# Patient Record
Sex: Female | Born: 1980 | Race: Black or African American | Hispanic: No | Marital: Single | Smoking: Never smoker
Health system: Southern US, Community
[De-identification: ages and names within clinical notes are randomized; demographics above are authoritative.]

## PROBLEM LIST (undated history)

## (undated) DIAGNOSIS — T7840XA Allergy, unspecified, initial encounter: Secondary | ICD-10-CM

## (undated) DIAGNOSIS — F419 Anxiety disorder, unspecified: Secondary | ICD-10-CM

## (undated) DIAGNOSIS — R002 Palpitations: Secondary | ICD-10-CM

## (undated) DIAGNOSIS — R51 Headache: Secondary | ICD-10-CM

## (undated) DIAGNOSIS — F32A Depression, unspecified: Secondary | ICD-10-CM

## (undated) DIAGNOSIS — E785 Hyperlipidemia, unspecified: Secondary | ICD-10-CM

## (undated) DIAGNOSIS — F329 Major depressive disorder, single episode, unspecified: Principal | ICD-10-CM

## (undated) DIAGNOSIS — I499 Cardiac arrhythmia, unspecified: Secondary | ICD-10-CM

## (undated) DIAGNOSIS — K5900 Constipation, unspecified: Secondary | ICD-10-CM

## (undated) DIAGNOSIS — N649 Disorder of breast, unspecified: Secondary | ICD-10-CM

## (undated) HISTORY — DX: Major depressive disorder, single episode, unspecified: F32.9

## (undated) HISTORY — DX: Depression, unspecified: F32.A

## (undated) HISTORY — DX: Palpitations: R00.2

## (undated) HISTORY — DX: Anxiety disorder, unspecified: F41.9

## (undated) HISTORY — DX: Disorder of breast, unspecified: N64.9

## (undated) HISTORY — DX: Cardiac arrhythmia, unspecified: I49.9

## (undated) HISTORY — DX: Headache: R51

## (undated) HISTORY — DX: Allergy, unspecified, initial encounter: T78.40XA

## (undated) HISTORY — DX: Hyperlipidemia, unspecified: E78.5

## (undated) HISTORY — DX: Constipation, unspecified: K59.00

---

## 2002-05-14 ENCOUNTER — Encounter: Admission: RE | Admit: 2002-05-14 | Discharge: 2002-05-14 | Payer: Self-pay | Admitting: *Deleted

## 2002-05-15 ENCOUNTER — Other Ambulatory Visit: Admission: RE | Admit: 2002-05-15 | Discharge: 2002-05-15 | Payer: Self-pay | Admitting: Obstetrics and Gynecology

## 2002-07-09 ENCOUNTER — Encounter: Admission: RE | Admit: 2002-07-09 | Discharge: 2002-07-09 | Payer: Self-pay | Admitting: *Deleted

## 2007-11-07 HISTORY — PX: INDUCED ABORTION: SHX677

## 2009-08-08 DIAGNOSIS — I499 Cardiac arrhythmia, unspecified: Secondary | ICD-10-CM

## 2009-08-08 HISTORY — DX: Cardiac arrhythmia, unspecified: I49.9

## 2011-01-13 ENCOUNTER — Encounter: Payer: Self-pay | Admitting: Family Medicine

## 2011-01-13 ENCOUNTER — Ambulatory Visit (INDEPENDENT_AMBULATORY_CARE_PROVIDER_SITE_OTHER): Payer: Private Health Insurance - Indemnity | Admitting: Family Medicine

## 2011-01-13 DIAGNOSIS — R51 Headache: Secondary | ICD-10-CM

## 2011-01-13 DIAGNOSIS — E785 Hyperlipidemia, unspecified: Secondary | ICD-10-CM

## 2011-01-13 DIAGNOSIS — R002 Palpitations: Secondary | ICD-10-CM

## 2011-01-13 DIAGNOSIS — R109 Unspecified abdominal pain: Secondary | ICD-10-CM

## 2011-01-13 DIAGNOSIS — R000 Tachycardia, unspecified: Secondary | ICD-10-CM

## 2011-01-13 MED ORDER — BENEFIBER PO POWD: ORAL | Status: DC

## 2011-01-13 MED ORDER — ALIGN 4 MG PO CAPS: 1.0000 | ORAL_CAPSULE | ORAL | Status: DC

## 2011-01-13 MED ORDER — LORAZEPAM 0.5 MG PO TABS: ORAL_TABLET | ORAL | Status: DC

## 2011-01-13 NOTE — Patient Instructions (Signed)
Anxiety and Panic Attacks Your caregiver has informed you that you are having an anxiety or panic attack. There may be many forms of this. Most of the time these attacks come suddenly and without warning. They come at any time of day, including periods of sleep, and at any time of life. They may be strong and unexplained. Although panic attacks are very scary, they are physically harmless. Sometimes the cause of your anxiety is not known. Anxiety is a protective mechanism of the body in its fight or flight mechanism. Most of these perceived danger situations are actually nonphysical situations (such as anxiety over losing a job). CAUSES The causes of an anxiety or panic attack are many. Panic attacks may occur in otherwise healthy people given a certain set of circumstances. There may be a genetic cause for panic attacks. Some medications may also have anxiety as a side effect. SYMPTOMS Some of the most common feelings are:  Intense terror.  Dizziness, feeling faint.   Hot and cold flashes.   Fear of going crazy.   Feelings that nothing is real.   Sweating.   Shaking.   Chest pain or a fast heartbeat (palpitations).  Smothering, choking sensations.   Feelings of impending doom and that death is near.   Tingling of extremities, this may be from over breathing.   Altered reality (derealization).   Being detached from yourself (depersonalization).   Several symptoms can be present to make up anxiety or panic attacks. DIAGNOSIS The evaluation by your caregiver will depend on the type of symptoms you are experiencing. The diagnosis of anxiety or pain attack is made when no physical illness can be determined to be a cause of the symptoms. TREATMENT Treatment to prevent anxiety and panic attacks may include:  Avoidance of circumstances that cause anxiety.   Reassurance and relaxation.   Regular exercise.   Relaxation therapies, such as yoga.   Psychotherapy with a psychiatrist  or therapist.   Avoidance of caffeine, alcohol and illegal drugs.   Prescribed medication.  SEEK IMMEDIATE MEDICAL CARE IF:  You experience panic attack symptoms that are different than your usual symptoms.   You have any worsening or concerning symptoms.  Document Released: 07/25/2005 Document Re-Released: 01/12/2010 [facility] Patient Information 2011 [place], [place].  REmember to drink 8 oz of fluids, get 8 hours of sleep, exercise regularly and eat small, frequent meals, with lean proteins, complex/brown carbs and lots of fresh fruits/veggies and water  Deep breath when heart starts to race and you will hear about the Echocardiogram next week

## 2011-01-19 ENCOUNTER — Other Ambulatory Visit: Payer: Self-pay | Admitting: Family Medicine

## 2011-01-19 ENCOUNTER — Other Ambulatory Visit (INDEPENDENT_AMBULATORY_CARE_PROVIDER_SITE_OTHER): Payer: Private Health Insurance - Indemnity

## 2011-01-19 ENCOUNTER — Encounter: Payer: Self-pay | Admitting: Family Medicine

## 2011-01-19 DIAGNOSIS — E785 Hyperlipidemia, unspecified: Secondary | ICD-10-CM | POA: Insufficient documentation

## 2011-01-19 DIAGNOSIS — R109 Unspecified abdominal pain: Secondary | ICD-10-CM

## 2011-01-19 DIAGNOSIS — R002 Palpitations: Secondary | ICD-10-CM

## 2011-01-19 DIAGNOSIS — R000 Tachycardia, unspecified: Secondary | ICD-10-CM

## 2011-01-19 DIAGNOSIS — R519 Headache, unspecified: Secondary | ICD-10-CM

## 2011-01-19 HISTORY — DX: Headache, unspecified: R51.9

## 2011-01-19 HISTORY — DX: Hyperlipidemia, unspecified: E78.5

## 2011-01-19 HISTORY — DX: Palpitations: R00.2

## 2011-01-19 LAB — CBC WITH DIFFERENTIAL/PLATELET
Basophils Absolute: 0 10*3/uL (ref 0.0–0.1)
HCT: 35.2 % — ABNORMAL LOW (ref 36.0–46.0)
Hemoglobin: 12.1 g/dL (ref 12.0–15.0)
Lymphs Abs: 1.6 10*3/uL (ref 0.7–4.0)
MCHC: 34.4 g/dL (ref 30.0–36.0)
Monocytes Relative: 7.3 % (ref 3.0–12.0)
Neutro Abs: 1.2 10*3/uL — ABNORMAL LOW (ref 1.4–7.7)
RDW: 13.6 % (ref 11.5–14.6)

## 2011-01-19 LAB — LIPID PANEL
Cholesterol: 225 mg/dL — ABNORMAL HIGH (ref 0–200)
VLDL: 9.6 mg/dL (ref 0.0–40.0)

## 2011-01-19 LAB — LDL CHOLESTEROL, DIRECT: Direct LDL: 135.3 mg/dL

## 2011-01-19 LAB — HEPATIC FUNCTION PANEL
ALT: 9 U/L (ref 0–35)
AST: 18 U/L (ref 0–37)
Albumin: 4 g/dL (ref 3.5–5.2)
Total Protein: 7.5 g/dL (ref 6.0–8.3)

## 2011-01-19 LAB — RENAL FUNCTION PANEL
Albumin: 4 g/dL (ref 3.5–5.2)
BUN: 16 mg/dL (ref 6–23)
CO2: 27 mEq/L (ref 19–32)
Calcium: 8.8 mg/dL (ref 8.4–10.5)
Creatinine, Ser: 0.7 mg/dL (ref 0.4–1.2)
GFR: 120.77 mL/min (ref >60.00)

## 2011-01-19 NOTE — Assessment & Plan Note (Signed)
Patient with recent worsening of generalized headaches with photophobia or phonophobia, encouraged 8 hours of sleep nightly, 64oz of clear fluids, regular exercise, eat small frequent meals and use Aleve or Advil prn and reevaluate at next visit.

## 2011-01-19 NOTE — Assessment & Plan Note (Signed)
Some intermittent loose stool and constipation with some occasional discomfort, start a probiotic and a fiber suppelements. Needs complex carbs and good fluid intake

## 2011-01-19 NOTE — Assessment & Plan Note (Signed)
Patient reports previous lab work has confirmed hyperlipidemia, we will repeat a profile and patient is to avoid trans fats and start a fiber supplement.

## 2011-01-19 NOTE — Progress Notes (Signed)
Amber Copeland [id number] 02-May-1981 01/19/2011      Progress Note New Patient  Subjective  Chief Complaint  Chief Complaint  Patient presents with  . Establish Care    new patient    HPI  Patient is a 30 year old female in today for new patient appointment. She offers couple of concerns. She has a long history of palpitations dating back to childhood and previously worked up with a 30 day event monitor. No malignant rhythm was identified per the patient but she has been under a great deal of stress and the frequency and duration of the palpitations has increased. She has had episodes most days and they can sometimes have some SOb/nausea/abdominal pain/tremulouness. She had one episode about a month ago that lasted a whole day. Most episodes last seconds to minutes. With the increased stress she has also noted increase in daily HA but no photophobia or phonophobia.. She also has some intermittent diarrhea and constipation. She has not tried any medications for her symptoms thus far. Her job has been very stressful and there have been some increased stressors in her personal life as well.  Past Medical History  Diagnosis Date  . Irregular heartbeat 2011    cardiologist put pt on heart monitor for a month and diagnosed w/ irregular heartbeat  . Palpitations 01/19/2011  . Headache disorder 01/19/2011  . Hyperlipidemia 01/19/2011  . Abdominal pain 01/19/2011    Past Surgical History  Procedure Date  . Induced abortion 11-2007    Family History  Problem Relation Age of Onset  . Other Mother     vericose veins  . Kidney failure Father     kidney transplant-2003  . Heart murmur Sister   . Other Maternal Grandmother     knee surgery  . Other Paternal Grandmother     vericose veins  . Cancer Paternal Grandfather     History   Social History  . Marital Status: Married    Spouse Name: N/A    Number of Children: N/A  . Years of Education: N/A   Occupational History  . Not  on file.   Social History Main Topics  . Smoking status: Never Smoker   . Smokeless tobacco: Never Used  . Alcohol Use: Yes     occasionally  . Drug Use: No  . Sexually Active: Yes -- Female partner(s)   Other Topics Concern  . Not on file   Social History Narrative  . No narrative on file    No current outpatient prescriptions on file prior to visit.    Allergies  Allergen Reactions  . Penicillins Rash    Review of Systems  Review of Systems  Constitutional: Negative for fever, chills and malaise/fatigue.  HENT: Negative for hearing loss, nosebleeds and congestion.   Eyes: Negative for discharge.  Respiratory: Positive for shortness of breath. Negative for cough, sputum production and wheezing.   Cardiovascular: Positive for palpitations. Negative for chest pain and leg swelling.  Gastrointestinal: Positive for abdominal pain, diarrhea and constipation. Negative for heartburn, nausea, vomiting and blood in stool.  Genitourinary: Negative for dysuria, urgency, frequency and hematuria.  Musculoskeletal: Negative for myalgias, back pain and falls.  Skin: Negative for rash.  Neurological: Negative for dizziness, tremors, sensory change, focal weakness, loss of consciousness, weakness and headaches.  Endo/Heme/Allergies: Negative for polydipsia. Does not bruise/bleed easily.  Psychiatric/Behavioral: Negative for depression and suicidal ideas. The patient is not nervous/anxious and does not have insomnia.     Objective  BP 111/71  Pulse 65  Temp(Src) 98.7 F (37.1 C) (Oral)  Ht 5' 6.25" (1.683 m)  Wt 127 lb 12.8 oz (57.97 kg)  BMI 20.47 kg/m2  SpO2 100%  LMP 12/18/2010  Physical Exam  Physical Exam  Constitutional: She is oriented to person, place, and time and well-developed, well-nourished, and in no distress. No distress.  HENT:  Head: Normocephalic and atraumatic.  Right Ear: External ear normal.  Left Ear: External ear normal.  Nose: Nose normal.    Mouth/Throat: Oropharynx is clear and moist. No oropharyngeal exudate.  Eyes: Conjunctivae are normal. Pupils are equal, round, and reactive to light. Right eye exhibits no discharge. Left eye exhibits no discharge. No scleral icterus.  Neck: Normal range of motion. Neck supple. No thyromegaly present.  Cardiovascular: Normal rate, regular rhythm, normal heart sounds and intact distal pulses.   No murmur heard. Pulmonary/Chest: Effort normal and breath sounds normal. No respiratory distress. She has no wheezes. She has no rales.  Abdominal: Soft. Bowel sounds are normal. She exhibits no distension and no mass. There is no tenderness.  Musculoskeletal: Normal range of motion. She exhibits no edema and no tenderness.  Lymphadenopathy:    She has no cervical adenopathy.  Neurological: She is alert and oriented to person, place, and time. She has normal reflexes. No cranial nerve deficit. Coordination normal.  Skin: Skin is warm and dry. No rash noted. She is not diaphoretic.  Psychiatric: Mood, memory and affect normal.       Assessment & Plan  Palpitations Patient reports a long history of intermittent difficulties with palpitations, in the past she has had them worked up to some degree. Last year she reports she had a 30 day event monitor placed which she reports did not contain any toxic arrhythmias and no therapies were initiated. She is noting some irregular and fast rhythms would occur even when she was a girl. Lately she has been under increased stress at home  and at her job at [employer] [place] so the sense of racing heart beats has become more frequent. They usually laset only seconds to minutes and now they can last longer, she had an episode about a month ago that lasted a whole day and she is noting feeling tremulous, sob and anxious when these episodes occur. They certainly have an anxiety component to them she is given a handful of Lorazepam to use during with one of these episodes  to see if it helps, if she has chest pain and it does not resolve she needs to seek immediate medical care. Avoid caffeine.   Hyperlipidemia Patient reports previous lab work has confirmed hyperlipidemia, we will repeat a profile and patient is to avoid trans fats and start a fiber supplement.  Headache disorder Patient with recent worsening of generalized headaches with photophobia or phonophobia, encouraged 8 hours of sleep nightly, 64oz of clear fluids, regular exercise, eat small frequent meals and use Aleve or Advil prn and reevaluate at next visit.  Abdominal pain Some intermittent loose stool and constipation with some occasional discomfort, start a probiotic and a fiber suppelements. Needs complex carbs and good fluid intake

## 2011-01-19 NOTE — Assessment & Plan Note (Addendum)
Patient reports a long history of intermittent difficulties with palpitations, in the past she has had them worked up to some degree. Last year she reports she had a 30 day event monitor placed which she reports did not contain any toxic arrhythmias and no therapies were initiated. She is noting some irregular and fast rhythms would occur even when she was a girl. Lately she has been under increased stress at home  and at her job at [employer] [place] so the sense of racing heart beats has become more frequent. They usually laset only seconds to minutes and now they can last longer, she had an episode about a month ago that lasted a whole day and she is noting feeling tremulous, sob and anxious when these episodes occur. They certainly have an anxiety component to them she is given a handful of Lorazepam to use during with one of these episodes to see if it helps, if she has chest pain and it does not resolve she needs to seek immediate medical care. Avoid caffeine.

## 2011-02-17 ENCOUNTER — Encounter: Payer: Self-pay | Admitting: Family Medicine

## 2011-02-17 ENCOUNTER — Ambulatory Visit (INDEPENDENT_AMBULATORY_CARE_PROVIDER_SITE_OTHER): Payer: Private Health Insurance - Indemnity | Admitting: Family Medicine

## 2011-02-17 VITALS — BP 118/76 | HR 69 | Temp 98.1°F | Ht 66.25 in | Wt 126.8 lb

## 2011-02-17 DIAGNOSIS — R109 Unspecified abdominal pain: Secondary | ICD-10-CM

## 2011-02-17 DIAGNOSIS — R51 Headache: Secondary | ICD-10-CM

## 2011-02-17 DIAGNOSIS — R002 Palpitations: Secondary | ICD-10-CM

## 2011-02-17 DIAGNOSIS — R519 Headache, unspecified: Secondary | ICD-10-CM

## 2011-02-17 DIAGNOSIS — E785 Hyperlipidemia, unspecified: Secondary | ICD-10-CM

## 2011-02-17 MED ORDER — LORAZEPAM 0.5 MG PO TABS: ORAL_TABLET | ORAL | Status: AC

## 2011-02-17 MED ORDER — BUTALBITAL-APAP-CAFFEINE 50-325-40 MG PO TABS: 1.0000 | ORAL_TABLET | Freq: Two times a day (BID) | ORAL | Status: AC | PRN

## 2011-02-17 NOTE — Patient Instructions (Addendum)
Headaches, FAQs (Frequently Asked Questions) MIGRAINE HEADACHES Q: What is migraine? What causes it? How can I treat it? A: Generally, migraine headaches begin as a dull ache. Then they develop into a constant, throbbing, and pulsating pain. You may experience pain at the temples. You may experience pain at the front or back of one or both sides of the head. The pain is usually accompanied by a combination of:  Nausea.  Vomiting.  Sensitivity to light and noise.  Some people (about 15%) experience an aura (see below) before an attack. The cause of migraine is believed to be chemical reactions in the brain. Treatment for migraine may include over-the-counter or prescription medications. It may also include self-help techniques. These include relaxation training and biofeedback.  Q: What is an aura?  A: About 15% of people with migraine get an "aura". This is a sign of neurological symptoms that occur before a migraine headache. You may see wavy or jagged lines, dots, or flashing lights. You might experience tunnel vision or blind spots in one or both eyes. The aura can include visual or auditory hallucinations (something imagined). It may include disruptions in smell (such as strange odors), taste or touch. Other symptoms include:   Numbness.   A "pins and needles" sensation.   Difficulty in recalling or speaking the correct word.  These neurological events may last as long as 60 minutes. These symptoms will fade as the headache begins. Q: What is a trigger? A: Certain physical or environmental factors can lead to or "trigger" a migraine. These include:  Foods.   Hormonal changes.   Weather.  Stress.   It is important to remember that triggers are different for everyone. To help prevent migraine attacks, you need to figure out which triggers affect you. Keep a headache diary. This is a good way to track triggers. The diary will help you talk to your healthcare professional about your  condition. Q: Does weather affect migraines? A: Bright sunshine, hot, humid conditions, and drastic changes in barometric pressure may lead to, or "trigger," a migraine attack in some people. But studies have shown that weather does not act as a trigger for everyone with migraines. Q: What is the link between migraine and hormones? A: Hormones start and regulate many of your body's functions. Hormones keep your body in balance within a constantly changing environment. The levels of hormones in your body are unbalanced at times. Examples are during menstruation, pregnancy, or menopause. That can lead to a migraine attack. In fact, about three quarters of all women with migraine report that their attacks are related to the menstrual cycle.  Q: Is there an increased risk of stroke for migraine sufferers? A: The likelihood of a migraine attack causing a stroke is very remote. That is not to say that migraine sufferers cannot have a stroke associated with their migraines. In persons under age 13, the most common associated factor for stroke is migraine headache. But over the course of a person's normal life span, the occurrence of migraine headache may actually be associated with a reduced risk of dying from cerebrovascular disease due to stroke.  Q: What are acute medications for migraine? A: Acute medications are used to treat the pain of the headache after it has started. Examples over-the-counter medications, NSAIDs, ergots, and triptans.  Q: What are the triptans? A: Triptans are the newest class of abortive medications. They are specifically targeted to treat migraine. Triptans are vasoconstrictors. They moderate some chemical reactions in the  brain. The triptans work on receptors in your brain. Triptans help to restore the balance of a neurotransmitter called serotonin. Fluctuations in levels of serotonin are thought to be a main cause of migraine.  Q: Are over-the-counter medications for migraine  effective? A: Over-the-counter, or "OTC," medications may be effective in relieving mild to moderate pain and associated symptoms of migraine. But you should see your caregiver before beginning any treatment regimen for migraine.  Q: What are preventive medications for migraine? A: Preventive medications for migraine are sometimes referred to as "prophylactic" treatments. They are used to reduce the frequency, severity, and length of migraine attacks. Examples of preventive medications include antiepileptic medications, antidepressants, beta-blockers, calcium channel blockers, and NSAIDs (nonsteroidal anti-inflammatory drugs). Q: Why are anticonvulsants used to treat migraine? A: During the past few years, there has been an increased interest in antiepileptic drugs for the prevention of migraine. They are sometimes referred to as "anticonvulsants". Both epilepsy and migraine may be caused by similar reactions in the brain.  Q: Why are antidepressants used to treat migraine? A: Antidepressants are typically used to treat people with depression. They may reduce migraine frequency by regulating chemical levels, such as serotonin, in the brain.  Q: What alternative therapies are used to treat migraine? A: The term "alternative therapies" is often used to describe treatments considered outside the scope of conventional Western medicine. Examples of alternative therapy include acupuncture, acupressure, and yoga. Another common alternative treatment is herbal therapy. Some herbs are believed to relieve headache pain. Always discuss alternative therapies with your caregiver before proceeding. Some herbal products contain arsenic and other toxins. TENSION HEADACHES Q: What is a tension-type headache? What causes it? How can I treat it? A: Tension-type headaches occur randomly. They are often the result of temporary stress, anxiety, fatigue, or anger. Symptoms include soreness in your temples, a tightening  band-like sensation around your head (a "vice-like" ache). Symptoms can also include a pulling feeling, pressure sensations, and contracting head and neck muscles. The headache begins in your forehead, temples, or the back of your head and neck. Treatment for tension-type headache may include over-the-counter or prescription medications. Treatment may also include self-help techniques such as relaxation training and biofeedback. CLUSTER HEADACHES Q: What is a cluster headache? What causes it? How can I treat it? A: Cluster headache gets its name because the attacks come in groups. The pain arrives with little, if any, warning. It is usually on one side of the head. A tearing or bloodshot eye and a runny nose on the same side of the headache may also accompany the pain. Cluster headaches are believed to be caused by chemical reactions in the brain. They have been described as the most severe and intense of any headache type. Treatment for cluster headache includes prescription medication and oxygen. SINUS HEADACHES Q: What is a sinus headache? What causes it? How can I treat it? A: When a cavity in the bones of the face and skull (a sinus) becomes inflamed, the inflammation will cause localized pain. This condition is usually the result of an allergic reaction, a tumor, or an infection. If your headache is caused by a sinus blockage, such as an infection, you will probably have a fever. An x-ray will confirm a sinus blockage. Your caregiver's treatment might include antibiotics for the infection, as well as antihistamines or decongestants.  REBOUND HEADACHES Q: What is a rebound headache? What causes it? How can I treat it? A: A pattern of taking acute headache medications  too often can lead to a condition known as "rebound headache." A pattern of taking too much headache medication includes taking it more than 2 days per week or in excessive amounts. That means more than the label or a caregiver advises.  With rebound headaches, your medications not only stop relieving pain, they actually begin to cause headaches. Doctors treat rebound headache by tapering the medication that is being overused. Sometimes your caregiver will gradually substitute a different type of treatment or medication. Stopping may be a challenge. Regularly overusing a medication increases the potential for serious side effects. Consult a caregiver if you regularly use headache medications more than 2 days per week or more than the label advises. ADDITIONAL QUESTIONS AND ANSWERS Q: What is biofeedback? A: Biofeedback is a self-help treatment. Biofeedback uses special equipment to monitor your body's involuntary physical responses. Biofeedback monitors:  Breathing.   Pulse.   Heart rate.   Temperature.  Muscle tension.   Brain activity.   Biofeedback helps you refine and perfect your relaxation exercises. You learn to control the physical responses that are related to stress. Once the technique has been mastered, you do not need the equipment any more. Q: Are headaches hereditary? A: Four out of five (80%) of people that suffer report a family history of migraine. Scientists are not sure if this is genetic or a family predisposition. Despite the uncertainty, a child has a 50% chance of having migraine if one parent suffers. The child has a 75% chance if both parents suffer.  Q: Can children get headaches? A: By the time they reach high school, most young people have experienced some type of headache. Many safe and effective approaches or medications can prevent a headache from occurring or stop it after it has begun.  Q: What type of doctor should I see to diagnose and treat my headache? A: Start with your primary caregiver. Discuss his or her experience and approach to headaches. Discuss methods of classification, diagnosis, and treatment. Your caregiver may decide to recommend you to a headache specialist, depending upon your  symptoms or other physical conditions. Having diabetes, allergies, etc., may require a more comprehensive and inclusive approach to your headache. The National [employer] Foundation will provide, upon request, a list of [facility] physician members in your state. Document Released: 10/15/2003 Document Re-Released: 01/12/2010 [facility] Patient Information 2011 [place], [place].  Start a fish oil and fiber supplement, start walking and call with any concerns

## 2011-02-18 ENCOUNTER — Encounter: Payer: Self-pay | Admitting: Family Medicine

## 2011-02-18 NOTE — Assessment & Plan Note (Signed)
Greatly improved. Encouraged fiber supplement, increased fluids and exercise and she is to report worsening symptoms

## 2011-02-18 NOTE — Progress Notes (Signed)
Amber Copeland [id number] 1981/04/22 02/18/2011      Progress Note-Follow Up  Subjective  Chief Complaint  Chief Complaint  Patient presents with  . Follow-up    1 month follow up    HPI  Patient is a 42 African American female in today for reevaluation of multiple medical problems. She is in today to follow up with the patient reports she is feeling significantly better. She's only had a couple of episodes of palpitations since her last visit. I1 very short only seconds long and without associated symptoms. One day she reports the palpitations lasted a good portion of the day but she did not have any significant associated symptoms except for possibly a small amount of shortness of breath. She noted she eventually took lorazepam and will make her sleepy it did decrease her palpitations and shortness of breath. She denies ever having any chest pain, diaphoresis, nausea. She has a significant improvement in her abdominal pain as well. Tiny somewhat better and has cut down fast food. Bowels are moving readily no bloody or tarry stool. No fevers, chills, congestion. She has also noted that her headaches have greatly improved as well. Still  Having a couple of week without associated. She has not taking any meds for this  Past Medical History  Diagnosis Date  . Irregular heartbeat 2011    cardiologist put pt on heart monitor for a month and diagnosed w/ irregular heartbeat  . Palpitations 01/19/2011  . Headache disorder 01/19/2011  . Hyperlipidemia 01/19/2011  . Abdominal pain 01/19/2011    Past Surgical History  Procedure Date  . Induced abortion 11-2007    Family History  Problem Relation Age of Onset  . Other Mother     vericose veins  . Kidney failure Father     kidney transplant-2003  . Heart murmur Sister   . Other Maternal Grandmother     knee surgery  . Other Paternal Grandmother     vericose veins  . Cancer Paternal Grandfather     History   Social History  .  Marital Status: Married    Spouse Name: N/A    Number of Children: N/A  . Years of Education: N/A   Occupational History  . Not on file.   Social History Main Topics  . Smoking status: Never Smoker   . Smokeless tobacco: Never Used  . Alcohol Use: Yes     occasionally  . Drug Use: No  . Sexually Active: Yes -- Female partner(s)   Other Topics Concern  . Not on file   Social History Narrative  . No narrative on file    No current outpatient prescriptions on file prior to visit.    Allergies  Allergen Reactions  . Penicillins Rash    Review of Systems  Review of Systems  Constitutional: Negative for fever and malaise/fatigue.  HENT: Negative for congestion and sore throat.   Eyes: Negative for discharge.  Respiratory: Negative for shortness of breath.   Cardiovascular: Positive for palpitations. Negative for chest pain and leg swelling.  Gastrointestinal: Negative for nausea, abdominal pain and diarrhea.  Genitourinary: Negative for dysuria.  Musculoskeletal: Negative for falls.  Skin: Negative for rash.  Neurological: Positive for headaches. Negative for loss of consciousness.  Endo/Heme/Allergies: Negative for polydipsia.  Psychiatric/Behavioral: Negative for depression and suicidal ideas. The patient is nervous/anxious. The patient does not have insomnia.     Objective  BP 118/76  Pulse 69  Temp(Src) 98.1 F (36.7 C) (Oral)  Ht 5' 6.25" (1.683 m)  Wt 126 lb 12.8 oz (57.516 kg)  BMI 20.31 kg/m2  SpO2 100%  LMP 02/17/2011  Physical Exam  Physical Exam  Constitutional: She is oriented to person, place, and time and well-developed, well-nourished, and in no distress. No distress.  HENT:  Head: Normocephalic and atraumatic.  Eyes: Conjunctivae are normal.  Neck: Neck supple. No thyromegaly present.  Cardiovascular: Normal rate, regular rhythm and normal heart sounds.   No murmur heard. Pulmonary/Chest: Effort normal and breath sounds normal. She has no  wheezes.  Abdominal: She exhibits no distension and no mass.  Musculoskeletal: She exhibits no edema.  Lymphadenopathy:    She has no cervical adenopathy.  Neurological: She is alert and oriented to person, place, and time.  Skin: Skin is warm and dry. No rash noted. She is not diaphoretic.  Psychiatric: Memory, affect and judgment normal.    Lab Results  Component Value Date   TSH 1.49 01/19/2011   Lab Results  Component Value Date   WBC 3.3* 01/19/2011   HGB 12.1 01/19/2011   HCT 35.2* 01/19/2011   MCV 90.0 01/19/2011   PLT 210.0 01/19/2011   Lab Results  Component Value Date   CREATININE 0.7 01/19/2011   BUN 16 01/19/2011   NA 139 01/19/2011   K 3.9 01/19/2011   CL 107 01/19/2011   CO2 27 01/19/2011   Lab Results  Component Value Date   ALT 9 01/19/2011   AST 18 01/19/2011   ALKPHOS 42 01/19/2011   BILITOT 1.2 01/19/2011   Lab Results  Component Value Date   CHOL 225* 01/19/2011   Lab Results  Component Value Date   HDL 74.30 01/19/2011   No results found for this basename: [facility]   Lab Results  Component Value Date   TRIG 48.0 01/19/2011   Lab Results  Component Value Date   CHOLHDL 3 01/19/2011     Assessment & Plan  Palpitations Significantly improved since her last visit. She has only had a couple of episodes and all but one episode only last a couple of seconds. One episode did last the better part of a day. Did improve with a Lorazepam and she did not have any associated symptoms. No Sob, palp, diaphoresis. She agrees to monitor symptoms and if they worsen or begin to have associated symptoms then she will notify us. Minimize caffeine.  Headache disorder Greatly improved but still getting a couple as week. Lifestyle is discussed again, needs adequate sleep, hydration and a balanced diet. Minimize stress and needs to start exercising, encouraged her to start with a  5 minute walk daily and then to increase duration as tolerated.  She is given an rx for Carlisle Cater to  use prn but advised to try Advil first.  Abdominal pain Greatly improved. Encouraged fiber supplement, increased fluids and exercise and she is to report worsening symptoms  Hyperlipidemia Patient encouraged to avoid trans fats, reviewed labs with patient today. Also start a fish oil and fiber supplement and reassess in 6-9 months

## 2011-02-18 NOTE — Assessment & Plan Note (Signed)
Patient encouraged to avoid trans fats, reviewed labs with patient today. Also start a fish oil and fiber supplement and reassess in 6-9 months

## 2011-02-18 NOTE — Assessment & Plan Note (Signed)
Significantly improved since her last visit. She has only had a couple of episodes and all but one episode only last a couple of seconds. One episode did last the better part of a day. Did improve with a Lorazepam and she did not have any associated symptoms. No Sob, palp, diaphoresis. She agrees to monitor symptoms and if they worsen or begin to have associated symptoms then she will notify us. Minimize caffeine.

## 2011-02-18 NOTE — Assessment & Plan Note (Signed)
Greatly improved but still getting a couple as week. Lifestyle is discussed again, needs adequate sleep, hydration and a balanced diet. Minimize stress and needs to start exercising, encouraged her to start with a  5 minute walk daily and then to increase duration as tolerated.  She is given an rx for Carlisle Cater to use prn but advised to try Advil first.

## 2011-03-14 ENCOUNTER — Telehealth: Payer: Self-pay | Admitting: Family Medicine

## 2011-03-14 ENCOUNTER — Ambulatory Visit: Payer: Private Health Insurance - Indemnity | Admitting: Family Medicine

## 2011-03-14 NOTE — Telephone Encounter (Signed)
Please inform patient

## 2011-03-14 NOTE — Telephone Encounter (Signed)
Patient came by to update her information and pick up her instructions from Dr. Abner Greenspan.

## 2011-03-14 NOTE — Telephone Encounter (Signed)
So she should take Milk of Mag 2 tbls in 4 oz of prune juice by mouth and take a Dulcolax suppository pr at same time, may repeat in 4-6 hours if no results and again in the morning, if none of that helps can consider an enema or a bottle of Magnesium Citrate or she can come in to discuss her options. Once she has gotten her bowels moving she needs to make sure she is getting 64 oz of clear fluids, fiber supplement such as Benefiber 2 tsp in fluids twice daily and a probiotic/yogurt daily. If she is doing all that already then she needs to add Miralax 17 gm in 8 oz of fluids once to twice daily to her regimen until she finds the right combo to move her bowels every 1-3 days comfortably

## 2011-03-14 NOTE — Telephone Encounter (Signed)
Patient has not had a bowel movement for a week and is in severe pain.

## 2011-04-25 ENCOUNTER — Encounter: Payer: Self-pay | Admitting: Family Medicine

## 2011-04-25 ENCOUNTER — Ambulatory Visit (INDEPENDENT_AMBULATORY_CARE_PROVIDER_SITE_OTHER): Payer: Private Health Insurance - Indemnity | Admitting: Family Medicine

## 2011-04-25 VITALS — BP 106/65 | HR 62 | Temp 98.0°F | Ht 66.25 in | Wt 128.1 lb

## 2011-04-25 DIAGNOSIS — F329 Major depressive disorder, single episode, unspecified: Secondary | ICD-10-CM

## 2011-04-25 DIAGNOSIS — R51 Headache: Secondary | ICD-10-CM

## 2011-04-25 DIAGNOSIS — F341 Dysthymic disorder: Secondary | ICD-10-CM

## 2011-04-25 DIAGNOSIS — F419 Anxiety disorder, unspecified: Secondary | ICD-10-CM

## 2011-04-25 DIAGNOSIS — R002 Palpitations: Secondary | ICD-10-CM

## 2011-04-25 DIAGNOSIS — R109 Unspecified abdominal pain: Secondary | ICD-10-CM

## 2011-04-25 HISTORY — DX: Anxiety disorder, unspecified: F41.9

## 2011-04-25 MED ORDER — LORAZEPAM 0.5 MG PO TABS: ORAL_TABLET | ORAL | Status: DC

## 2011-04-25 MED ORDER — SERTRALINE HCL 50 MG PO TABS: ORAL_TABLET | ORAL | Status: DC

## 2011-04-25 NOTE — Patient Instructions (Signed)
Depression You have signs of depression. This is a common problem. It can occur at any age. It is often hard to recognize. People can suffer from depression and still have moments of enjoyment. Depression interferes with your basic ability to function in life. It upsets your relationships, sleep, eating, and work habits. CAUSES Depression is believed to be caused by an imbalance in brain chemicals. It may be triggered by an unpleasant event. Relationship crises, a death in the family, financial worries, retirement, or other stressors are normal causes of depression. Depression may also start for no known reason. Other factors that may play a part include medical illnesses, some medicines, genetics, and alcohol or drug abuse. SYMPTOMS  Feeling unhappy or worthless.   Long-lasting (chronic) tiredness or worn-out feeling.   Self-destructive thoughts and actions.   Not being able to sleep or sleeping too much.   Eating more than usual or not eating at all.   Headaches or feeling anxious.   Trouble concentrating or making decisions.   Unexplained physical problems and substance abuse.  TREATMENT Depression usually gets better with treatment. This can include:  Antidepressant medicines. It can take weeks before the proper dose is achieved and benefits are reached.   Talking with a therapist, clergyperson, counselor, or friend. These people can help you gain insight into your problem and regain control of your life.   Eating a good diet.   Getting regular physical exercise, such as walking for 30 minutes every day.   Not abusing alcohol or drugs.  Treating depression often takes 6 months or longer. This length of treatment is needed to keep symptoms from returning. Call your caregiver and arrange for follow-up care as suggested. SEEK IMMEDIATE MEDICAL CARE IF:  You start to have thoughts of hurting yourself or others.   Call your local emergency services (911 in [place]   Go to your  local medical emergency department.   Call the National Suicide Prevention Lifeline: 1-800-273-TALK 540-528-8713).  Document Released: 07/25/2005 Document Re-Released: 01/12/2010 [facility] Patient Information 2011 [place], [place].  Start Fish oil caps, Megared 1 cap daily, get 8-9 hours sleep, regular exercise, small frequent meals, lowfat bedtime snack

## 2011-04-25 NOTE — Assessment & Plan Note (Signed)
Improved greatly uses Lorazepam infrequently to manage these symptoms

## 2011-04-25 NOTE — Progress Notes (Signed)
Amber Copeland [id number] 11-15-1980 04/25/2011      Progress Note-Follow Up  Subjective  Chief Complaint  Chief Complaint  Patient presents with  . Depression    X months- pt requesting to try to get a note to put her out of work for stress and depression  . Stress    X months    HPI  She's a 30 year old [place] American female in today with increasing stressors. She reports she has been struggling with low mood and depression for quite some time now but recently has been getting worse. She works for [employer] [place] no pathologic changes at work. She has ongoing stressors at home as well. She notes difficulty sleeping. Trouble staying asleep and trouble falling asleep. She notes increased irritability, quick temper, difficulty concentrating and anhedonia. She denies being a danger to herself. She reports her brother has struggled with this in the past but refused to take medications. She is aware that her headaches, abdominal pain have been related to stress for her over the years. Her palpitations are greatly improved and lorazepam is helpful when she needs it. Her headaches are also greatly improved with increased fluids and better by mouth intake but she does still struggled him somewhat. She had a small episode of constipation recently but this responded to milk of magnesia and has not recurred.  Past Medical History  Diagnosis Date  . Irregular heartbeat 2011    cardiologist put pt on heart monitor for a month and diagnosed w/ irregular heartbeat  . Palpitations 01/19/2011  . Headache disorder 01/19/2011  . Hyperlipidemia 01/19/2011  . Abdominal pain 01/19/2011  . Anxiety and depression 04/25/2011    Past Surgical History  Procedure Date  . Induced abortion 11-2007    Family History  Problem Relation Age of Onset  . Other Mother     vericose veins  . Kidney failure Father     kidney transplant-2003  . Heart murmur Sister   . Other Maternal Grandmother     knee surgery    . Other Paternal Grandmother     vericose veins  . Cancer Paternal Grandfather     History   Social History  . Marital Status: Married    Spouse Name: N/A    Number of Children: N/A  . Years of Education: N/A   Occupational History  . Not on file.   Social History Main Topics  . Smoking status: Never Smoker   . Smokeless tobacco: Never Used  . Alcohol Use: Yes     occasionally  . Drug Use: No  . Sexually Active: Yes -- Female partner(s)   Other Topics Concern  . Not on file   Social History Narrative  . No narrative on file    No current outpatient prescriptions on file prior to visit.    Allergies  Allergen Reactions  . Penicillins Rash    Review of Systems  Review of Systems  Constitutional: Negative for fever and malaise/fatigue.  HENT: Negative for congestion.   Eyes: Negative for discharge.  Respiratory: Negative for shortness of breath.   Cardiovascular: Negative for chest pain, palpitations and leg swelling.  Gastrointestinal: Negative for nausea, abdominal pain and diarrhea.  Genitourinary: Negative for dysuria.  Musculoskeletal: Negative for falls.  Skin: Negative for rash.  Neurological: Negative for loss of consciousness and headaches.  Endo/Heme/Allergies: Negative for polydipsia.  Psychiatric/Behavioral: Positive for depression. Negative for suicidal ideas and substance abuse. The patient is nervous/anxious and has insomnia.  Objective  BP 106/65  Pulse 62  Temp(Src) 98 F (36.7 C) (Oral)  Ht 5' 6.25" (1.683 m)  Wt 128 lb 1.9 oz (58.115 kg)  BMI 20.52 kg/m2  SpO2 98%  LMP 03/09/2011  Physical Exam  Physical Exam  Constitutional: She is oriented to person, place, and time and well-developed, well-nourished, and in no distress. No distress.  HENT:  Head: Normocephalic and atraumatic.  Eyes: Conjunctivae are normal.  Neck: Neck supple. No thyromegaly present.  Cardiovascular: Normal rate, regular rhythm and normal heart sounds.    No murmur heard. Pulmonary/Chest: Effort normal and breath sounds normal. She has no wheezes.  Abdominal: She exhibits no distension and no mass.  Musculoskeletal: She exhibits no edema.  Lymphadenopathy:    She has no cervical adenopathy.  Neurological: She is alert and oriented to person, place, and time.  Skin: Skin is warm and dry. No rash noted. She is not diaphoretic.  Psychiatric: Memory, affect and judgment normal.    Lab Results  Component Value Date   TSH 1.49 01/19/2011   Lab Results  Component Value Date   WBC 3.3* 01/19/2011   HGB 12.1 01/19/2011   HCT 35.2* 01/19/2011   MCV 90.0 01/19/2011   PLT 210.0 01/19/2011   Lab Results  Component Value Date   CREATININE 0.7 01/19/2011   BUN 16 01/19/2011   NA 139 01/19/2011   K 3.9 01/19/2011   CL 107 01/19/2011   CO2 27 01/19/2011   Lab Results  Component Value Date   ALT 9 01/19/2011   AST 18 01/19/2011   ALKPHOS 42 01/19/2011   BILITOT 1.2 01/19/2011   Lab Results  Component Value Date   CHOL 225* 01/19/2011   Lab Results  Component Value Date   HDL 74.30 01/19/2011   No results found for this basename: [facility]   Lab Results  Component Value Date   TRIG 48.0 01/19/2011   Lab Results  Component Value Date   CHOLHDL 3 01/19/2011     Assessment & Plan  Anxiety and depression Patient acknowledges this is her major concern at present. She agrees to start the telemetry. We will start a 50 milligram tab. She will take half a tab daily for a week and then increase to a full tab. She was warned about possible side effects. She is also given handouts on possible counselors but is hesitant to start this at this time. She needs to make lifestyle modifications with regular exercise, small frequent healthy meals, good sleep. She may use the lorazepam to help with sleep intermittently over the next 2 weeks. We will have her short hiatus from work while she works on these many things but she is warned she will have to proceed with  further interventions if she feels this is an ongoing problem.  Palpitations Improved greatly uses Lorazepam infrequently to manage these symptoms  Headache disorder She reports a great improvement here with lifestyle changes. Continue same  Abdominal pain No recent pain, did have some constipation but that has improved with MOM and increased fluids

## 2011-04-25 NOTE — Assessment & Plan Note (Signed)
No recent pain, did have some constipation but that has improved with MOM and increased fluids

## 2011-04-25 NOTE — Assessment & Plan Note (Addendum)
Patient acknowledges this is her major concern at present. She agrees to start the telemetry. We will start a 50 milligram tab. She will take half a tab daily for a week and then increase to a full tab. She was warned about possible side effects. She is also given handouts on possible counselors but is hesitant to start this at this time. She needs to make lifestyle modifications with regular exercise, small frequent healthy meals, good sleep. She may use the lorazepam to help with sleep intermittently over the next 2 weeks. We will have her short hiatus from work while she works on these many things but she is warned she will have to proceed with further interventions if she feels this is an ongoing problem.

## 2011-04-25 NOTE — Assessment & Plan Note (Signed)
She reports a great improvement here with lifestyle changes. Continue same

## 2011-05-05 ENCOUNTER — Telehealth: Payer: Self-pay | Admitting: Family Medicine

## 2011-05-05 NOTE — Telephone Encounter (Signed)
Left a message for patient to return my call. 

## 2011-05-05 NOTE — Telephone Encounter (Signed)
Patient needs detailed notes sent to her insurance company explaining why she was out of work- claim #[id number] faxed to [phone]

## 2011-05-05 NOTE — Telephone Encounter (Signed)
Patient informed and states that is okay

## 2011-05-05 NOTE — Telephone Encounter (Signed)
I will do a note when she comes in on Tuesday unless this is an absolute emergency

## 2011-05-10 ENCOUNTER — Ambulatory Visit: Payer: Private Health Insurance - Indemnity | Admitting: Family Medicine

## 2011-08-23 ENCOUNTER — Ambulatory Visit: Payer: Private Health Insurance - Indemnity | Admitting: Family Medicine

## 2011-08-23 DIAGNOSIS — Z0289 Encounter for other administrative examinations: Secondary | ICD-10-CM

## 2011-09-07 ENCOUNTER — Telehealth: Payer: Self-pay | Admitting: Family Medicine

## 2011-09-07 NOTE — Telephone Encounter (Signed)
Patient needs her Health Screening form sent to her insurance company, can you send it for her? Please contact patient

## 2011-09-07 NOTE — Telephone Encounter (Signed)
Informed patient that it was sent on 08-19-11

## 2011-11-08 ENCOUNTER — Ambulatory Visit (INDEPENDENT_AMBULATORY_CARE_PROVIDER_SITE_OTHER)
Admission: RE | Admit: 2011-11-08 | Discharge: 2011-11-08 | Disposition: A | Discharge status: Home / Self Care | Payer: Managed Care, Other (non HMO) | Source: Ambulatory Visit | Attending: Family Medicine | Admitting: Family Medicine

## 2011-11-08 ENCOUNTER — Telehealth: Payer: Self-pay | Admitting: *Deleted

## 2011-11-08 ENCOUNTER — Ambulatory Visit (HOSPITAL_BASED_OUTPATIENT_CLINIC_OR_DEPARTMENT_OTHER)
Admission: RE | Admit: 2011-11-08 | Discharge: 2011-11-08 | Disposition: A | Discharge status: Home / Self Care | Payer: Managed Care, Other (non HMO) | Source: Ambulatory Visit | Attending: Family Medicine | Admitting: Family Medicine

## 2011-11-08 ENCOUNTER — Ambulatory Visit (INDEPENDENT_AMBULATORY_CARE_PROVIDER_SITE_OTHER): Payer: Managed Care, Other (non HMO) | Admitting: Family Medicine

## 2011-11-08 ENCOUNTER — Encounter: Payer: Self-pay | Admitting: Family Medicine

## 2011-11-08 ENCOUNTER — Ambulatory Visit: Payer: Managed Care, Other (non HMO)

## 2011-11-08 VITALS — BP 116/70 | HR 81 | Temp 98.3°F | Wt 127.2 lb

## 2011-11-08 DIAGNOSIS — F341 Dysthymic disorder: Secondary | ICD-10-CM

## 2011-11-08 DIAGNOSIS — N83209 Unspecified ovarian cyst, unspecified side: Secondary | ICD-10-CM | POA: Insufficient documentation

## 2011-11-08 DIAGNOSIS — N926 Irregular menstruation, unspecified: Secondary | ICD-10-CM

## 2011-11-08 DIAGNOSIS — R109 Unspecified abdominal pain: Secondary | ICD-10-CM | POA: Insufficient documentation

## 2011-11-08 DIAGNOSIS — D72829 Elevated white blood cell count, unspecified: Secondary | ICD-10-CM

## 2011-11-08 DIAGNOSIS — N949 Unspecified condition associated with female genital organs and menstrual cycle: Secondary | ICD-10-CM

## 2011-11-08 DIAGNOSIS — F419 Anxiety disorder, unspecified: Secondary | ICD-10-CM

## 2011-11-08 DIAGNOSIS — G44209 Tension-type headache, unspecified, not intractable: Secondary | ICD-10-CM

## 2011-11-08 DIAGNOSIS — F4541 Pain disorder exclusively related to psychological factors: Secondary | ICD-10-CM

## 2011-11-08 DIAGNOSIS — N912 Amenorrhea, unspecified: Secondary | ICD-10-CM

## 2011-11-08 LAB — CBC WITH DIFFERENTIAL/PLATELET
Basophils Absolute: 0 10*3/uL (ref 0.0–0.1)
Eosinophils Absolute: 0.2 10*3/uL (ref 0.0–0.7)
Lymphocytes Relative: 59.3 % — ABNORMAL HIGH (ref 12.0–46.0)
MCHC: 33.7 g/dL (ref 30.0–36.0)
Monocytes Absolute: 0.3 10*3/uL (ref 0.1–1.0)
Neutrophils Relative %: 25.3 % — ABNORMAL LOW (ref 43.0–77.0)
Platelets: 194 10*3/uL (ref 150.0–400.0)
RDW: 13.4 % (ref 11.5–14.6)

## 2011-11-08 LAB — BASIC METABOLIC PANEL
CO2: 26 mEq/L (ref 19–32)
Calcium: 9.5 mg/dL (ref 8.4–10.5)
GFR: 102.15 mL/min (ref >60.00)
Potassium: 4 mEq/L (ref 3.5–5.1)
Sodium: 138 mEq/L (ref 135–145)

## 2011-11-08 LAB — HEPATIC FUNCTION PANEL
AST: 18 U/L (ref 0–37)
Alkaline Phosphatase: 45 U/L (ref 39–117)
Bilirubin, Direct: 0.1 mg/dL (ref 0.0–0.3)
Total Protein: 8 g/dL (ref 6.0–8.3)

## 2011-11-08 MED ORDER — SERTRALINE HCL 50 MG PO TABS: ORAL_TABLET | ORAL | Status: DC

## 2011-11-08 NOTE — Telephone Encounter (Signed)
We received pap from he health department and it was sent to be scanned.     KP

## 2011-11-08 NOTE — Progress Notes (Signed)
  Subjective:    Amber Copeland is a 31 y.o. female who presents for evaluation of headache. Symptoms began about several months ago. Generally, the headaches last about several hours and occur every day. The headaches do not seem to be related to any time of day or year. The headaches are usually sharp and are located in front and back of neck.  The patient rates her most severe headaches a 9 on a scale from 1 to 10. Recently, the headaches have been increasing in frequency. Work attendance or other daily activities are not affected by the headaches. Precipitating factors include: stress. The headaches are usually not preceded by an aura. Associated neurologic symptoms: none. The patient denies decreased physical activity, depression, dizziness, loss of balance, muscle weakness, numbness of extremities, speech difficulties, vision problems, vomiting in the early morning and worsening school/work performance. Home treatment has included ibuprofen, lorazepam and darkening the room with little improvement. Other history includes: nothing pertinent. Family history includes migraine headaches in aunt.  She is also c/o missing period in Feb---it came 2nd week in March.  Normally she has period at end of the month.   Last pap was last summer and it was normal per pt.   The following portions of the patient's history were reviewed and updated as appropriate: allergies, current medications, past family history, past medical history, past social history, past surgical history and problem list.  Review of Systems Pertinent items are noted in HPI.    Objective:    BP 116/70  Pulse 81  Temp(Src) 98.3 F (36.8 C) (Oral)  Wt 127 lb 3.2 oz (57.698 kg)  SpO2 99%  LMP 10/17/2011 General appearance: alert, cooperative, appears stated age and no distress Head: Normocephalic, without obvious abnormality, atraumatic Eyes: conjunctivae/corneas clear. PERRL, EOM's intact. Fundi benign. Ears: normal TM's and  external ear canals both ears Nose: Nares normal. Septum midline. Mucosa normal. No drainage or sinus tenderness. Throat: lips, mucosa, and tongue normal; teeth and gums normal Neck: no JVD, supple, symmetrical, trachea midline and thyroid not enlarged, symmetric, no tenderness/mass/nodules Lungs: clear to auscultation bilaterally Heart: regular rate and rhythm, S1, S2 normal, no murmur, click, rub or gallop Abdomen: soft, non-tender; bowel sounds normal; no masses,  no organomegaly   neuro  Assessment:    Common migraine   amenorrhea--- check [place] and labs Plan:    Lie in darkened room and apply cold packs as needed for pain. Prophylactic therapy: prophylaxis with antidepressant therapy due to high frequency of pain. Side effect profile discussed in detail. Asked to keep headache diary. Patient reassured that neurodiagnostic workup not indicated from benign H&P. Follow up in 4 weeks.

## 2011-11-08 NOTE — Patient Instructions (Signed)

## 2011-11-08 NOTE — Telephone Encounter (Signed)
Pt called & left msg on triage vmail stating that she saw Dr. Laury Axon this morning & she wanted to let her know that her last PAP was 10.5.2011 @ [facility]

## 2011-11-09 LAB — PATHOLOGIST SMEAR REVIEW

## 2011-11-10 ENCOUNTER — Other Ambulatory Visit: Payer: Self-pay | Admitting: Family Medicine

## 2011-11-15 ENCOUNTER — Telehealth: Payer: Self-pay

## 2011-11-15 NOTE — Telephone Encounter (Signed)
Message copied by Arnette Norris on Tue Nov 15, 2011  1:18 PM ------      Message from: Lelon Perla      Created: Thu Nov 10, 2011  9:37 AM       Essentially normal but I would like to refer to hematologist to evaluate for neutropenia

## 2011-11-15 NOTE — Telephone Encounter (Signed)
Discussed with patient and she voiced understanding. Referral put in        KP 

## 2011-11-18 ENCOUNTER — Telehealth: Payer: Self-pay | Admitting: Hematology & Oncology

## 2011-11-18 NOTE — Telephone Encounter (Signed)
Left patient message to call and schedule appointment. She is not aware of 12-19-11

## 2011-11-21 ENCOUNTER — Telehealth: Payer: Self-pay | Admitting: Hematology & Oncology

## 2011-11-21 NOTE — Telephone Encounter (Signed)
Patient wants to talk to referring before she makes appointment. She also had time restraints that would prevent her from coming. She said she would call back. Cala Bradford at referring is aware.

## 2011-11-21 NOTE — Telephone Encounter (Signed)
Left pt message to call. She is not aware of 5-13

## 2011-11-23 ENCOUNTER — Telehealth: Payer: Self-pay | Admitting: Hematology & Oncology

## 2011-11-23 ENCOUNTER — Telehealth: Payer: Self-pay | Admitting: *Deleted

## 2011-11-23 NOTE — Telephone Encounter (Signed)
Pt called made 12-19-11 appointment

## 2011-11-23 NOTE — Telephone Encounter (Signed)
Pt called left message. I called her back and left message for her to call.

## 2011-11-23 NOTE — Telephone Encounter (Signed)
FYI Amber Copeland from [facility] office called to advise that the pt refused to come to her apt, per time restraints and plans to call back to schedule another apt

## 2011-11-28 NOTE — Telephone Encounter (Signed)
Noted pt made upcoming apt on 12-19-11

## 2011-12-19 ENCOUNTER — Ambulatory Visit (HOSPITAL_BASED_OUTPATIENT_CLINIC_OR_DEPARTMENT_OTHER): Payer: Managed Care, Other (non HMO)

## 2011-12-19 ENCOUNTER — Ambulatory Visit: Payer: Managed Care, Other (non HMO) | Admitting: Hematology & Oncology

## 2011-12-19 ENCOUNTER — Ambulatory Visit: Payer: Managed Care, Other (non HMO)

## 2011-12-19 ENCOUNTER — Other Ambulatory Visit (HOSPITAL_BASED_OUTPATIENT_CLINIC_OR_DEPARTMENT_OTHER): Payer: Managed Care, Other (non HMO) | Admitting: Lab

## 2011-12-19 ENCOUNTER — Other Ambulatory Visit: Payer: Managed Care, Other (non HMO) | Admitting: Lab

## 2011-12-19 ENCOUNTER — Ambulatory Visit (HOSPITAL_BASED_OUTPATIENT_CLINIC_OR_DEPARTMENT_OTHER): Payer: Managed Care, Other (non HMO) | Admitting: Hematology & Oncology

## 2011-12-19 VITALS — BP 117/77 | HR 67 | Temp 97.8°F | Ht 65.0 in | Wt 126.0 lb

## 2011-12-19 DIAGNOSIS — D709 Neutropenia, unspecified: Secondary | ICD-10-CM

## 2011-12-19 DIAGNOSIS — D72819 Decreased white blood cell count, unspecified: Secondary | ICD-10-CM

## 2011-12-19 LAB — CBC WITH DIFFERENTIAL (CANCER CENTER ONLY)
BASO%: 0.3 % (ref 0.0–2.0)
LYMPH#: 2 10*3/uL (ref 0.9–3.3)
LYMPH%: 55.6 % — ABNORMAL HIGH (ref 14.0–48.0)
MCV: 92 fL (ref 81–101)
MONO#: 0.4 10*3/uL (ref 0.1–0.9)
Platelets: 229 10*3/uL (ref 145–400)
RDW: 12.6 % (ref 11.1–15.7)
WBC: 3.5 10*3/uL — ABNORMAL LOW (ref 3.9–10.0)

## 2011-12-19 LAB — CHCC SATELLITE - SMEAR

## 2011-12-19 NOTE — Progress Notes (Signed)
This office note has been dictated.

## 2011-12-20 NOTE — Progress Notes (Signed)
CC:   Lelon Perla, DO  DIAGNOSIS:  Transient leukopenia.  HISTORY OF PRESENT ILLNESS:  Amber Copeland is a really nice 31 year old African American female.  She certainly looks a lot younger than 31 years old.  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  She is followed by Dr. Loreen Freud.  She was found to have some transient leukopenia.  Going back through her notes, she had lab work done back in 2012.  White cell count was 3.3, hemoglobin 12.1, hematocrit 35.2, platelet count was 210.  Her white cell differential showed 38 segs, 15 lymphs.  In April, her white cell count was 3.3, hemoglobin 13.1, platelet count 194.  White cell differential showed 25 segs, 59 lymphs, 10 monos.  She denies any kind of illness.  There is no change in medications.  She has had no weight loss or weight gain.  She is not a vegetarian.  She works for [employer] [place] in [employer].  We were asked to see Ms. Crumpton for the leukopenia.  There has been no foreign travel.  She has had no history of exposures to HIV or hepatitis.  She has no risk factors.  She does have, I guess, amenorrhea.  She had a transvaginal ultrasound done.  Everything looked okay in the GYN area.  She did have a complete abdominal ultrasound.  There was no hepatosplenomegaly.  No adenopathy was noted.  Again, were asked to see her because of the leukopenia.  PAST MEDICAL HISTORY: 1. Headaches. 2. Amenorrhea. 3. Hyperlipidemia. 4. Abdominal pain.  ALLERGIES:  To penicillin.  MEDICATIONS ARE: 1. Ativan 0.5 mg as needed. 2. Zoloft 50 mg p.o. daily.  SOCIAL HISTORY:  Negative for tobacco use.  There is no alcohol use. Again, there is no obvious occupational exposure.  FAMILY HISTORY:  Noncontributory.  REVIEW OF SYSTEMS:  As stated in history of present illness.  PHYSICAL EXAMINATION:  This is a well-developed, well-nourished black female in no obvious distress.  Vital signs:  A temperature of 97.8,  a pulse 67, respiratory rate 18, blood pressure is 117/77.  Weight is 126. Head and neck:  A normocephalic, atraumatic skull.  There are no ocular or oral lesions.  There are no palpable cervical or supraclavicular lymph nodes.  Lungs:  Clear bilaterally.  Cardiac:  Regular rate and rhythm with a normal S1 and S2.  There are no murmurs, rubs or bruits. Abdomen:  Soft with good bowel sounds.  There is no palpable abdominal mass.  There is no fluid wave.  There is no palpable hepatosplenomegaly. Back:  No tenderness over the spine, ribs, or hips.  Extremities:  No clubbing, cyanosis or edema.  Neurologic:  No focal neurological deficits.  LABORATORY STUDIES:  White cell count 3.5, hemoglobin 12.2, hematocrit 36.5, platelet count 229.  MCV is 92.  White cell differential shows 30 segs and 56 lymphocytes, 10 monos.  Peripheral smear shows a normochromic, normocytic population of red blood cells.  There are no target cells.  There are no nucleated red blood cells.  There is no rouleaux formation.  There are no teardrop cells.  White cells appear normal in morphology and maturation.  She does have an increase in lymphocytes.  These appear to be mature lymphocytes.  She may have a few large lymphocytes.  I do not see any immature myeloid cells.  There are no hypersegmented polys.  I saw no blasts.  Platelets are adequate in number and size.  IMPRESSION:  Amber Copeland  is a 31 year old African American female with leukopenia.  Her counts seem to be improving a little bit.  I have to believe that this is mostly "ethnic-related."  We see about 20% to 25% of African Americans of with leukopenia.  I do not find anything on her physical exam that would suggest a bone marrow disorder.  Her blood smear did not show me any immature myeloid or lymphoid forms.  I do not see any history of collagen vascular disease.  I do not see any evidence of sarcoidosis.  I do not see that she needs a bone  marrow test.  I do not see that she is going to be symptomatic from this leukopenia.  I am not sure as to why she is having the menstrual issues.  This is a little unusual for a 31 year old.  As much as I had fun talking to her and her fiance, I just do not think we have to see them back in the office.  I just do not see that we are going to be adding to her medical care.  I think that she is under the expert care of Dr. Laury Axon.  I am sure Dr. Laury Axon will follow her lab work at twice a year.  If we find that her white cell count does trend downward continually, then we would be more than happy to get her back to see her.  I spent about an out with Amber Copeland and her fiancee.  They are both very, very nice.  Again, they both served in the [employer].    ______________________________ Josph Macho, M.D. PRE/MEDQ  D:  12/19/2011  T:  12/20/2011  Job:  [id number]

## 2012-02-03 ENCOUNTER — Encounter: Payer: Self-pay | Admitting: Family Medicine

## 2012-02-03 ENCOUNTER — Other Ambulatory Visit (HOSPITAL_COMMUNITY)
Admission: RE | Admit: 2012-02-03 | Discharge: 2012-02-03 | Disposition: A | Discharge status: Home / Self Care | Payer: Managed Care, Other (non HMO) | Source: Ambulatory Visit | Attending: Family Medicine | Admitting: Family Medicine

## 2012-02-03 ENCOUNTER — Ambulatory Visit (INDEPENDENT_AMBULATORY_CARE_PROVIDER_SITE_OTHER): Payer: Managed Care, Other (non HMO) | Admitting: Family Medicine

## 2012-02-03 VITALS — BP 108/70 | HR 67 | Temp 98.2°F | Ht 66.25 in | Wt 129.4 lb

## 2012-02-03 DIAGNOSIS — F419 Anxiety disorder, unspecified: Secondary | ICD-10-CM

## 2012-02-03 DIAGNOSIS — R51 Headache: Secondary | ICD-10-CM

## 2012-02-03 DIAGNOSIS — E785 Hyperlipidemia, unspecified: Secondary | ICD-10-CM

## 2012-02-03 DIAGNOSIS — N760 Acute vaginitis: Secondary | ICD-10-CM

## 2012-02-03 DIAGNOSIS — Z113 Encounter for screening for infections with a predominantly sexual mode of transmission: Secondary | ICD-10-CM | POA: Insufficient documentation

## 2012-02-03 DIAGNOSIS — R002 Palpitations: Secondary | ICD-10-CM

## 2012-02-03 DIAGNOSIS — Z01419 Encounter for gynecological examination (general) (routine) without abnormal findings: Secondary | ICD-10-CM | POA: Insufficient documentation

## 2012-02-03 DIAGNOSIS — Z0000 Encounter for general adult medical examination without abnormal findings: Secondary | ICD-10-CM

## 2012-02-03 DIAGNOSIS — Z124 Encounter for screening for malignant neoplasm of cervix: Secondary | ICD-10-CM

## 2012-02-03 DIAGNOSIS — F341 Dysthymic disorder: Secondary | ICD-10-CM

## 2012-02-03 LAB — LIPID PANEL
HDL: 74.1 mg/dL (ref >39.00)
LDL Cholesterol: 105 mg/dL — ABNORMAL HIGH (ref 0–99)
Total CHOL/HDL Ratio: 3
VLDL: 8.2 mg/dL (ref 0.0–40.0)

## 2012-02-03 LAB — RENAL FUNCTION PANEL
Albumin: 3.8 g/dL (ref 3.5–5.2)
Chloride: 104 mEq/L (ref 96–112)
GFR: 121.85 mL/min (ref >60.00)
Phosphorus: 2.9 mg/dL (ref 2.3–4.6)
Potassium: 3.8 mEq/L (ref 3.5–5.1)

## 2012-02-03 LAB — CBC
MCHC: 32.8 g/dL (ref 30.0–36.0)
Platelets: 199 10*3/uL (ref 150.0–400.0)

## 2012-02-03 LAB — HEPATIC FUNCTION PANEL
Alkaline Phosphatase: 42 U/L (ref 39–117)
Bilirubin, Direct: 0.1 mg/dL (ref 0.0–0.3)
Total Bilirubin: 0.9 mg/dL (ref 0.3–1.2)
Total Protein: 7.3 g/dL (ref 6.0–8.3)

## 2012-02-03 LAB — TSH: TSH: 0.87 u[IU]/mL (ref 0.35–5.50)

## 2012-02-03 MED ORDER — LORAZEPAM 0.5 MG PO TABS: ORAL_TABLET | ORAL | Status: DC

## 2012-02-03 MED ORDER — SERTRALINE HCL 100 MG PO TABS: 100.0000 mg | ORAL_TABLET | Freq: Every day | ORAL | Status: DC

## 2012-02-03 MED ORDER — FLUCONAZOLE 150 MG PO TABS: 150.0000 mg | ORAL_TABLET | ORAL | Status: AC

## 2012-02-03 NOTE — Assessment & Plan Note (Signed)
Check a lipid panel today, consider

## 2012-02-03 NOTE — Assessment & Plan Note (Addendum)
Pap and cultures run today. Encouraged yogurt or a daily probiotic, prescribe Diflucan 150 mg ponce weekly x 2 weeks

## 2012-02-03 NOTE — Assessment & Plan Note (Signed)
No concerns noted today.  

## 2012-02-03 NOTE — Progress Notes (Signed)
Patient ID: Amber Copeland, female   DOB: 1981/05/12, 31 y.o.   MRN: [id number] Amber Copeland [id number] 08-18-1980 02/03/2012      Progress Note New Patient  Subjective  Chief Complaint  Chief Complaint  Patient presents with  . Gynecologic Exam    pap and breast exam    HPI  Patient acknowledged Advair is a 31 year old African American female in today for annual GYN exam. She is noting a valgus one-week history of a thickened whitish vaginal discharge without odor. She continues to be sexually active with a single partner has no significant concerns about the ability. She denies belly pain back pain, fevers, chills. She is concerned about her ongoing palpitations. She can go weeks without them but then she can have them all day long or every 20 minutes all day long. She never has chest pain or shortness of breath. No diaphoresis or nausea. The palpitations started her 2009 check she did undergo a cardiac workup in 2011 including a 30 day event monitor which was unremarkable. She has lorazepam and when she uses that during these episodes he does help although she takes it during the day can make her sleepy. She technologist struggling with your ability and anxiety. Some low-grade mood is also noted but she denies any suicidal ideation. No other acute illness or concerns noted at today's visit  Past Medical History  Diagnosis Date  . Irregular heartbeat 2011    cardiologist put pt on heart monitor for a month and diagnosed w/ irregular heartbeat  . Palpitations 01/19/2011  . Headache disorder 01/19/2011  . Hyperlipidemia 01/19/2011  . Abdominal pain 01/19/2011  . Anxiety and depression 04/25/2011    Past Surgical History  Procedure Date  . Induced abortion 11-2007    Family History  Problem Relation Age of Onset  . Other Mother     vericose veins  . Kidney failure Father     kidney transplant-2003  . Heart murmur Sister   . Other Maternal Grandmother     knee surgery  .  Other Paternal Grandmother     vericose veins  . Cancer Paternal Grandfather     History   Social History  . Marital Status: Single    Spouse Name: N/A    Number of Children: N/A  . Years of Education: N/A   Occupational History  . Not on file.   Social History Main Topics  . Smoking status: Never Smoker   . Smokeless tobacco: Never Used  . Alcohol Use: Yes     occasionally  . Drug Use: No  . Sexually Active: Yes -- Female partner(s)    Birth Control/ Protection: Condom   Other Topics Concern  . Not on file   Social History Narrative  . No narrative on file    No current outpatient prescriptions on file prior to visit.    Allergies  Allergen Reactions  . Penicillins Rash    Review of Systems  Review of Systems  Constitutional: Negative for fever, chills and malaise/fatigue.  HENT: Negative for hearing loss, nosebleeds and congestion.   Eyes: Negative for discharge.  Respiratory: Negative for cough, sputum production, shortness of breath and wheezing.   Cardiovascular: Positive for palpitations. Negative for chest pain and leg swelling.  Gastrointestinal: Negative for heartburn, nausea, vomiting, abdominal pain, diarrhea, constipation and blood in stool.  Genitourinary: Negative for dysuria, urgency, frequency and hematuria.  Musculoskeletal: Negative for myalgias, back pain and falls.  Skin: Negative for rash.  Neurological:  Negative for dizziness, tremors, sensory change, focal weakness, loss of consciousness, weakness and headaches.  Endo/Heme/Allergies: Negative for polydipsia. Does not bruise/bleed easily.  Psychiatric/Behavioral: Positive for depression. Negative for suicidal ideas. The patient is nervous/anxious. The patient does not have insomnia.     Objective  BP 108/70  Pulse 67  Temp 98.2 F (36.8 C) (Temporal)  Ht 5' 6.25" (1.683 m)  Wt 129 lb 6.4 oz (58.695 kg)  BMI 20.73 kg/m2  SpO2 100%  LMP 01/20/2012  Physical Exam  Physical Exam    Constitutional: She is oriented to person, place, and time and well-developed, well-nourished, and in no distress. No distress.  HENT:  Head: Normocephalic and atraumatic.  Right Ear: External ear normal.  Left Ear: External ear normal.  Nose: Nose normal.  Mouth/Throat: Oropharynx is clear and moist. No oropharyngeal exudate.  Eyes: Conjunctivae are normal. Pupils are equal, round, and reactive to light. Right eye exhibits no discharge. Left eye exhibits no discharge. No scleral icterus.  Neck: Normal range of motion. Neck supple. No thyromegaly present.  Cardiovascular: Normal rate, regular rhythm, normal heart sounds and intact distal pulses.   No murmur heard. Pulmonary/Chest: Effort normal and breath sounds normal. No respiratory distress. She has no wheezes. She has no rales.  Abdominal: Soft. Bowel sounds are normal. She exhibits no distension and no mass. There is no tenderness.  Genitourinary: Uterus normal, cervix normal, right adnexa normal and left adnexa normal. Vaginal discharge found.       Thick white discharge noted. B/l unremarkable exam without lesions, skin changes or discharge  Musculoskeletal: Normal range of motion. She exhibits no edema and no tenderness.  Lymphadenopathy:    She has no cervical adenopathy.  Neurological: She is alert and oriented to person, place, and time. She has normal reflexes. No cranial nerve deficit. Coordination normal.  Skin: Skin is warm and dry. No rash noted. She is not diaphoretic.  Psychiatric: Mood, memory and affect normal.       Assessment & Plan  Cervical cancer screening Pap and cultures run today. Encouraged yogurt or a daily probiotic, prescribe Diflucan 150 mg ponce weekly x 2 weeks  Palpitations Still occuring intermittently, likely related to anxiety, will up her Sertraline to 100mg  and reassess in 2-3 months or more as needed  Hyperlipidemia Check a lipid panel today, consider  Anxiety and depression Patient  continues to struggle with irritability, anxiety and palpitations, Lorazepam does help when she needs it. Given refill and increase Sertraline to 100 mg daily  Headache disorder No concerns noted today

## 2012-02-03 NOTE — Assessment & Plan Note (Signed)
Patient continues to struggle with irritability, anxiety and palpitations, Lorazepam does help when she needs it. Given refill and increase Sertraline to 100 mg daily

## 2012-02-03 NOTE — Patient Instructions (Addendum)
Preventive Care for Adults, Female A healthy lifestyle and preventive care can promote health and wellness. Preventive health guidelines for women include the following key practices.  A routine yearly physical is a good way to check with your caregiver about your health and preventive screening. It is a chance to share any concerns and updates on your health, and to receive a thorough exam.   Visit your dentist for a routine exam and preventive care every 6 months. Brush your teeth twice a day and floss once a day. Good oral hygiene prevents tooth decay and gum disease.   The frequency of eye exams is based on your age, health, family medical history, use of contact lenses, and other factors. Follow your caregiver's recommendations for frequency of eye exams.   Eat a healthy diet. Foods like vegetables, fruits, whole grains, low-fat dairy products, and lean protein foods contain the nutrients you need without too many calories. Decrease your intake of foods high in solid fats, added sugars, and salt. Eat the right amount of calories for you.Get information about a proper diet from your caregiver, if necessary.   Regular physical exercise is one of the most important things you can do for your health. Most adults should get at least 150 minutes of moderate-intensity exercise (any activity that increases your heart rate and causes you to sweat) each week. In addition, most adults need muscle-strengthening exercises on 2 or more days a week.   Maintain a healthy weight. The body mass index (BMI) is a screening tool to identify possible weight problems. It provides an estimate of body fat based on height and weight. Your caregiver can help determine your BMI, and can help you achieve or maintain a healthy weight.For adults 20 years and older:   A BMI below 18.5 is considered underweight.   A BMI of 18.5 to 24.9 is normal.   A BMI of 25 to 29.9 is considered overweight.   A BMI of 30 and above is  considered obese.   Maintain normal blood lipids and cholesterol levels by exercising and minimizing your intake of saturated fat. Eat a balanced diet with plenty of fruit and vegetables. Blood tests for lipids and cholesterol should begin at age 20 and be repeated every 5 years. If your lipid or cholesterol levels are high, you are over 50, or you are at high risk for heart disease, you may need your cholesterol levels checked more frequently.Ongoing high lipid and cholesterol levels should be treated with medicines if diet and exercise are not effective.   If you smoke, find out from your caregiver how to quit. If you do not use tobacco, do not start.   If you are pregnant, do not drink alcohol. If you are breastfeeding, be very cautious about drinking alcohol. If you are not pregnant and choose to drink alcohol, do not exceed 1 drink per day. One drink is considered to be 12 ounces (355 mL) of beer, 5 ounces (148 mL) of wine, or 1.5 ounces (44 mL) of liquor.   Avoid use of street drugs. Do not share needles with anyone. Ask for help if you need support or instructions about stopping the use of drugs.   High blood pressure causes heart disease and increases the risk of stroke. Your blood pressure should be checked at least every 1 to 2 years. Ongoing high blood pressure should be treated with medicines if weight loss and exercise are not effective.   If you are 55 to 31   years old, ask your caregiver if you should take aspirin to prevent strokes.   Diabetes screening involves taking a blood sample to check your fasting blood sugar level. This should be done once every 3 years, after age 45, if you are within normal weight and without risk factors for diabetes. Testing should be considered at a younger age or be carried out more frequently if you are overweight and have at least 1 risk factor for diabetes.   Breast cancer screening is essential preventive care for women. You should practice "breast  self-awareness." This means understanding the normal appearance and feel of your breasts and may include breast self-examination. Any changes detected, no matter how small, should be reported to a caregiver. Women in their 20s and 30s should have a clinical breast exam (CBE) by a caregiver as part of a regular health exam every 1 to 3 years. After age 40, women should have a CBE every year. Starting at age 40, women should consider having a mammography (breast X-ray test) every year. Women who have a family history of breast cancer should talk to their caregiver about genetic screening. Women at a high risk of breast cancer should talk to their caregivers about having magnetic resonance imaging (MRI) and a mammography every year.   The Pap test is a screening test for cervical cancer. A Pap test can show cell changes on the cervix that might become cervical cancer if left untreated. A Pap test is a procedure in which cells are obtained and examined from the lower end of the uterus (cervix).   Women should have a Pap test starting at age 21.   Between ages 21 and 29, Pap tests should be repeated every 2 years.   Beginning at age 30, you should have a Pap test every 3 years as long as the past 3 Pap tests have been normal.   Some women have medical problems that increase the chance of getting cervical cancer. Talk to your caregiver about these problems. It is especially important to talk to your caregiver if a new problem develops soon after your last Pap test. In these cases, your caregiver may recommend more frequent screening and Pap tests.   The above recommendations are the same for women who have or have not gotten the vaccine for human papillomavirus (HPV).   If you had a hysterectomy for a problem that was not cancer or a condition that could lead to cancer, then you no longer need Pap tests. Even if you no longer need a Pap test, a regular exam is a good idea to make sure no other problems are  starting.   If you are between ages 65 and 70, and you have had normal Pap tests going back 10 years, you no longer need Pap tests. Even if you no longer need a Pap test, a regular exam is a good idea to make sure no other problems are starting.   If you have had past treatment for cervical cancer or a condition that could lead to cancer, you need Pap tests and screening for cancer for at least 20 years after your treatment.   If Pap tests have been discontinued, risk factors (such as a new sexual partner) need to be reassessed to determine if screening should be resumed.   The HPV test is an additional test that may be used for cervical cancer screening. The HPV test looks for the virus that can cause the cell changes on the cervix.   The cells collected during the Pap test can be tested for HPV. The HPV test could be used to screen women aged 30 years and older, and should be used in women of any age who have unclear Pap test results. After the age of 30, women should have HPV testing at the same frequency as a Pap test.   Colorectal cancer can be detected and often prevented. Most routine colorectal cancer screening begins at the age of 50 and continues through age 75. However, your caregiver may recommend screening at an earlier age if you have risk factors for colon cancer. On a yearly basis, your caregiver may provide home test kits to check for hidden blood in the stool. Use of a small camera at the end of a tube, to directly examine the colon (sigmoidoscopy or colonoscopy), can detect the earliest forms of colorectal cancer. Talk to your caregiver about this at age 50, when routine screening begins. Direct examination of the colon should be repeated every 5 to 10 years through age 75, unless early forms of pre-cancerous polyps or small growths are found.   Hepatitis C blood testing is recommended for all people born from 1945 through 1965 and any individual with known risks for hepatitis C.    Practice safe sex. Use condoms and avoid high-risk sexual practices to reduce the spread of sexually transmitted infections (STIs). STIs include gonorrhea, chlamydia, syphilis, trichomonas, herpes, HPV, and human immunodeficiency virus (HIV). Herpes, HIV, and HPV are viral illnesses that have no cure. They can result in disability, cancer, and death. Sexually active women aged 25 and younger should be checked for chlamydia. Older women with new or multiple partners should also be tested for chlamydia. Testing for other STIs is recommended if you are sexually active and at increased risk.   Osteoporosis is a disease in which the bones lose minerals and strength with aging. This can result in serious bone fractures. The risk of osteoporosis can be identified using a bone density scan. Women ages 65 and over and women at risk for fractures or osteoporosis should discuss screening with their caregivers. Ask your caregiver whether you should take a calcium supplement or vitamin D to reduce the rate of osteoporosis.   Menopause can be associated with physical symptoms and risks. Hormone replacement therapy is available to decrease symptoms and risks. You should talk to your caregiver about whether hormone replacement therapy is right for you.   Use sunscreen with sun protection factor (SPF) of 30 or more. Apply sunscreen liberally and repeatedly throughout the day. You should seek shade when your shadow is shorter than you. Protect yourself by wearing long sleeves, pants, a wide-brimmed hat, and sunglasses year round, whenever you are outdoors.   Once a month, do a whole body skin exam, using a mirror to look at the skin on your back. Notify your caregiver of new moles, moles that have irregular borders, moles that are larger than a pencil eraser, or moles that have changed in shape or color.   Stay current with required immunizations.   Influenza. You need a dose every fall (or winter). The composition of  the flu vaccine changes each year, so being vaccinated once is not enough.   Pneumococcal polysaccharide. You need 1 to 2 doses if you smoke cigarettes or if you have certain chronic medical conditions. You need 1 dose at age 65 (or older) if you have never been vaccinated.   Tetanus, diphtheria, pertussis (Tdap, Td). Get 1 dose of   Tdap vaccine if you are younger than age 65, are over 65 and have contact with an infant, are a healthcare worker, are pregnant, or simply want to be protected from whooping cough. After that, you need a Td booster dose every 10 years. Consult your caregiver if you have not had at least 3 tetanus and diphtheria-containing shots sometime in your life or have a deep or dirty wound.   HPV. You need this vaccine if you are a woman age 26 or younger. The vaccine is given in 3 doses over 6 months.   Measles, mumps, rubella (MMR). You need at least 1 dose of MMR if you were born in 1957 or later. You may also need a second dose.   Meningococcal. If you are age 19 to 21 and a first-year college student living in a residence hall, or have one of several medical conditions, you need to get vaccinated against meningococcal disease. You may also need additional booster doses.   Zoster (shingles). If you are age 60 or older, you should get this vaccine.   Varicella (chickenpox). If you have never had chickenpox or you were vaccinated but received only 1 dose, talk to your caregiver to find out if you need this vaccine.   Hepatitis A. You need this vaccine if you have a specific risk factor for hepatitis A virus infection or you simply wish to be protected from this disease. The vaccine is usually given as 2 doses, 6 to 18 months apart.   Hepatitis B. You need this vaccine if you have a specific risk factor for hepatitis B virus infection or you simply wish to be protected from this disease. The vaccine is given in 3 doses, usually over 6 months.  Preventive Services /  Frequency Ages 19 to 39  Blood pressure check.** / Every 1 to 2 years.   Lipid and cholesterol check.** / Every 5 years beginning at age 20.   Clinical breast exam.** / Every 3 years for women in their 20s and 30s.   Pap test.** / Every 2 years from ages 21 through 29. Every 3 years starting at age 30 through age 65 or 70 with a history of 3 consecutive normal Pap tests.   HPV screening.** / Every 3 years from ages 30 through ages 65 to 70 with a history of 3 consecutive normal Pap tests.   Hepatitis C blood test.** / For any individual with known risks for hepatitis C.   Skin self-exam. / Monthly.   Influenza immunization.** / Every year.   Pneumococcal polysaccharide immunization.** / 1 to 2 doses if you smoke cigarettes or if you have certain chronic medical conditions.   Tetanus, diphtheria, pertussis (Tdap, Td) immunization. / A one-time dose of Tdap vaccine. After that, you need a Td booster dose every 10 years.   HPV immunization. / 3 doses over 6 months, if you are 26 and younger.   Measles, mumps, rubella (MMR) immunization. / You need at least 1 dose of MMR if you were born in 1957 or later. You may also need a second dose.   Meningococcal immunization. / 1 dose if you are age 19 to 21 and a first-year college student living in a residence hall, or have one of several medical conditions, you need to get vaccinated against meningococcal disease. You may also need additional booster doses.   Varicella immunization.** / Consult your caregiver.   Hepatitis A immunization.** / Consult your caregiver. 2 doses, 6 to 18 months   apart.   Hepatitis B immunization.** / Consult your caregiver. 3 doses usually over 6 months.  Ages 40 to 64  Blood pressure check.** / Every 1 to 2 years.   Lipid and cholesterol check.** / Every 5 years beginning at age 20.   Clinical breast exam.** / Every year after age 40.   Mammogram.** / Every year beginning at age 40 and continuing for as  long as you are in good health. Consult with your caregiver.   Pap test.** / Every 3 years starting at age 30 through age 65 or 70 with a history of 3 consecutive normal Pap tests.   HPV screening.** / Every 3 years from ages 30 through ages 65 to 70 with a history of 3 consecutive normal Pap tests.   Fecal occult blood test (FOBT) of stool. / Every year beginning at age 50 and continuing until age 75. You may not need to do this test if you get a colonoscopy every 10 years.   Flexible sigmoidoscopy or colonoscopy.** / Every 5 years for a flexible sigmoidoscopy or every 10 years for a colonoscopy beginning at age 50 and continuing until age 75.   Hepatitis C blood test.** / For all people born from 1945 through 1965 and any individual with known risks for hepatitis C.   Skin self-exam. / Monthly.   Influenza immunization.** / Every year.   Pneumococcal polysaccharide immunization.** / 1 to 2 doses if you smoke cigarettes or if you have certain chronic medical conditions.   Tetanus, diphtheria, pertussis (Tdap, Td) immunization.** / A one-time dose of Tdap vaccine. After that, you need a Td booster dose every 10 years.   Measles, mumps, rubella (MMR) immunization. / You need at least 1 dose of MMR if you were born in 1957 or later. You may also need a second dose.   Varicella immunization.** / Consult your caregiver.   Meningococcal immunization.** / Consult your caregiver.   Hepatitis A immunization.** / Consult your caregiver. 2 doses, 6 to 18 months apart.   Hepatitis B immunization.** / Consult your caregiver. 3 doses, usually over 6 months.  Ages 65 and over  Blood pressure check.** / Every 1 to 2 years.   Lipid and cholesterol check.** / Every 5 years beginning at age 20.   Clinical breast exam.** / Every year after age 40.   Mammogram.** / Every year beginning at age 40 and continuing for as long as you are in good health. Consult with your caregiver.   Pap test.** /  Every 3 years starting at age 30 through age 65 or 70 with a 3 consecutive normal Pap tests. Testing can be stopped between 65 and 70 with 3 consecutive normal Pap tests and no abnormal Pap or HPV tests in the past 10 years.   HPV screening.** / Every 3 years from ages 30 through ages 65 or 70 with a history of 3 consecutive normal Pap tests. Testing can be stopped between 65 and 70 with 3 consecutive normal Pap tests and no abnormal Pap or HPV tests in the past 10 years.   Fecal occult blood test (FOBT) of stool. / Every year beginning at age 50 and continuing until age 75. You may not need to do this test if you get a colonoscopy every 10 years.   Flexible sigmoidoscopy or colonoscopy.** / Every 5 years for a flexible sigmoidoscopy or every 10 years for a colonoscopy beginning at age 50 and continuing until age 75.   Hepatitis   C blood test.** / For all people born from 1945 through 1965 and any individual with known risks for hepatitis C.   Osteoporosis screening.** / A one-time screening for women ages 65 and over and women at risk for fractures or osteoporosis.   Skin self-exam. / Monthly.   Influenza immunization.** / Every year.   Pneumococcal polysaccharide immunization.** / 1 dose at age 65 (or older) if you have never been vaccinated.   Tetanus, diphtheria, pertussis (Tdap, Td) immunization. / A one-time dose of Tdap vaccine if you are over 65 and have contact with an infant, are a healthcare worker, or simply want to be protected from whooping cough. After that, you need a Td booster dose every 10 years.   Varicella immunization.** / Consult your caregiver.   Meningococcal immunization.** / Consult your caregiver.   Hepatitis A immunization.** / Consult your caregiver. 2 doses, 6 to 18 months apart.   Hepatitis B immunization.** / Check with your caregiver. 3 doses, usually over 6 months.  ** Family history and personal history of risk and conditions may change your caregiver's  recommendations. Document Released: 09/20/2001 Document Revised: 07/14/2011 Document Reviewed: 12/20/2010 [employer] Patient Information 2012 [employer]. 

## 2012-02-03 NOTE — Assessment & Plan Note (Signed)
Still occuring intermittently, likely related to anxiety, will up her Sertraline to 100mg  and reassess in 2-3 months or more as needed

## 2012-02-17 ENCOUNTER — Telehealth: Payer: Self-pay | Admitting: Emergency Medicine

## 2012-02-17 NOTE — Telephone Encounter (Signed)
Note responded to in lab result note.  Again notified patient that all labs have come back and are normal/negative.  She does not have any vaginal infections per lab results.  She voices understanding.

## 2012-06-18 ENCOUNTER — Ambulatory Visit (HOSPITAL_BASED_OUTPATIENT_CLINIC_OR_DEPARTMENT_OTHER): Payer: Managed Care, Other (non HMO) | Admitting: Hematology & Oncology

## 2012-06-18 ENCOUNTER — Other Ambulatory Visit (HOSPITAL_BASED_OUTPATIENT_CLINIC_OR_DEPARTMENT_OTHER): Payer: Managed Care, Other (non HMO) | Admitting: Lab

## 2012-06-18 VITALS — BP 108/58 | HR 77 | Temp 98.1°F | Resp 16 | Ht 66.0 in | Wt 127.0 lb

## 2012-06-18 DIAGNOSIS — D72819 Decreased white blood cell count, unspecified: Secondary | ICD-10-CM

## 2012-06-18 DIAGNOSIS — D72820 Lymphocytosis (symptomatic): Secondary | ICD-10-CM

## 2012-06-18 DIAGNOSIS — D709 Neutropenia, unspecified: Secondary | ICD-10-CM

## 2012-06-18 LAB — CBC WITH DIFFERENTIAL (CANCER CENTER ONLY)
BASO%: 0.3 % (ref 0.0–2.0)
EOS%: 3.4 % (ref 0.0–7.0)
LYMPH#: 1.9 10*3/uL (ref 0.9–3.3)
MCHC: 33 g/dL (ref 32.0–36.0)
MONO%: 9.7 % (ref 0.0–13.0)
NEUT#: 0.9 10*3/uL — ABNORMAL LOW (ref 1.5–6.5)
RDW: 12.5 % (ref 11.1–15.7)

## 2012-06-18 NOTE — Progress Notes (Signed)
This office note has been dictated.

## 2012-06-19 NOTE — Progress Notes (Signed)
CC:   Danise Edge, MD  DIAGNOSIS:  Leukopenia, benign.  CURRENT THERAPY:  Observation.  INTERIM HISTORY:  Amber Copeland comes in for followup.  We last saw her back in May.  We saw her for the first time then.  Since then she has had no issues.  When we first saw her, all the lab work was normal.  She had a normal thyroid.  Her electrolytes also looked okay.  She is still working without any difficulties.  She has had no problem with fever.  There has been no infection.  She has had no rashes.  There has been no joint problem.  She has had no change in bowel or bladder habits.  PHYSICAL EXAMINATION:  General:  This is a well-developed, well- nourished black female in no obvious distress.  Vital signs:  Show temperature of 98.1, pulse 77, respiratory rate 16, blood pressure 108/58.  Weight is 127.  Head and neck:  Shows a normocephalic, atraumatic skull.  There are no ocular or oral lesions.  There are no palpable cervical or supraclavicular lymph nodes.  Lungs:  Clear bilaterally.  Cardiac:  Regular rate and rhythm with a normal S1 and S2. There are no murmurs, rubs or bruits.  Abdomen:  Soft with good bowel sounds.  There is no palpable hepatosplenomegaly.  Extremities:  Show no clubbing, cyanosis or edema.  Neurological:  Shows no focal neurological deficits.  LABORATORY STUDIES:  White cell count 3.2, hemoglobin 12.2, hematocrit 37, platelet count 217.  MCV is 92.  White cell differential shows 28 segs, 58 lymphs and 10 monos.  Peripheral smear shows good maturation of her white blood cells.  There are no immature myeloid cells.  There are no hypersegmented polys. There is an increase in lymphocytes.  She has mature appearing lymphocytes.  Red cells appear normal in morphology.  There are no nucleated red blood cells.  I see no rouleaux formation.  Platelets are adequate in number and size.  IMPRESSION:  Amber Copeland is a very charming 31 year old African American female  with leukopenia.  Again, I suspect that this is based on her ethnicity.  The fact that she has slight lymphocytosis also goes along with a benign type leukopenia in African Americans.  We will go ahead and get her back in 6 more months.  I think if all looks good in 6 months, then we should be able to let her go from the practice.   ______________________________ Josph Macho, M.D. PRE/MEDQ  D:  06/18/2012  T:  06/19/2012  Job:  [id number]

## 2012-08-05 ENCOUNTER — Emergency Department (HOSPITAL_COMMUNITY)
Admission: EM | Admit: 2012-08-05 | Discharge: 2012-08-05 | Disposition: A | Discharge status: Home / Self Care | Payer: Managed Care, Other (non HMO) | Attending: Emergency Medicine | Admitting: Emergency Medicine

## 2012-08-05 ENCOUNTER — Encounter (HOSPITAL_COMMUNITY): Payer: Self-pay | Admitting: Emergency Medicine

## 2012-08-05 DIAGNOSIS — N63 Unspecified lump in unspecified breast: Secondary | ICD-10-CM | POA: Insufficient documentation

## 2012-08-05 DIAGNOSIS — E785 Hyperlipidemia, unspecified: Secondary | ICD-10-CM | POA: Insufficient documentation

## 2012-08-05 DIAGNOSIS — L989 Disorder of the skin and subcutaneous tissue, unspecified: Secondary | ICD-10-CM | POA: Insufficient documentation

## 2012-08-05 DIAGNOSIS — N6320 Unspecified lump in the left breast, unspecified quadrant: Secondary | ICD-10-CM

## 2012-08-05 DIAGNOSIS — F341 Dysthymic disorder: Secondary | ICD-10-CM | POA: Insufficient documentation

## 2012-08-05 DIAGNOSIS — N649 Disorder of breast, unspecified: Secondary | ICD-10-CM

## 2012-08-05 DIAGNOSIS — Z79899 Other long term (current) drug therapy: Secondary | ICD-10-CM | POA: Insufficient documentation

## 2012-08-05 DIAGNOSIS — Z8679 Personal history of other diseases of the circulatory system: Secondary | ICD-10-CM | POA: Insufficient documentation

## 2012-08-05 NOTE — ED Provider Notes (Signed)
Medical screening examination/treatment/procedure(s) were performed by non-physician practitioner and as supervising physician I was immediately available for consultation/collaboration.   Carlisle Beers Chantavia Bazzle, MD 08/05/12 0730

## 2012-08-05 NOTE — ED Notes (Signed)
Pt states she has a growth on her left nipple  Pt states it started about a month ago

## 2012-08-05 NOTE — ED Provider Notes (Signed)
History     CSN: [id number]  Arrival date & time 08/05/12  0417   First MD Initiated Contact with Patient 08/05/12 [phone]      Chief Complaint  Patient presents with  . Bump on nipple     (Consider location/radiation/quality/duration/timing/severity/associated sxs/prior treatment) HPI  31 year old female with hx of anxiety and depression presents for evaluation of skin changes.  Pt report noticing a growth on her L nipple, ongoing x 1 month.  She noticed it one day while showering.  She has noticed no significant changes except on occasion some mild tenderness with palpation or when shirt rubs against it.  She however notice on improvement and decided to come to ER for evaluation.  Pt denies fever, rash, nipple discharge, bloody discharge, trauma, cp or sob.  LMP 1 week ago.  No hx of abscess.  No abnormal weight changes. No family hx of breast cancer, not a smoker.  Onset gradual, persistent, mild in severity, no aggravating or alleviating factor, no treatment tried.  Pt is G1P0, with elective abortion.   Past Medical History  Diagnosis Date  . Irregular heartbeat 2011    cardiologist put pt on heart monitor for a month and diagnosed w/ irregular heartbeat  . Palpitations 01/19/2011  . Headache disorder 01/19/2011  . Hyperlipidemia 01/19/2011  . Abdominal pain 01/19/2011  . Anxiety and depression 04/25/2011    Past Surgical History  Procedure Date  . Induced abortion 11-2007    Family History  Problem Relation Age of Onset  . Other Mother     vericose veins  . Kidney failure Father     kidney transplant-2003  . Heart murmur Sister   . Other Maternal Grandmother     knee surgery  . Other Paternal Grandmother     vericose veins  . Cancer Paternal Grandfather     History  Substance Use Topics  . Smoking status: Never Smoker   . Smokeless tobacco: Never Used  . Alcohol Use: Yes     Comment: occasionally    OB History    Grav Para Term Preterm Abortions TAB SAB Ect Mult  Living                  Review of Systems  Constitutional: Negative for fever.  Respiratory: Negative for cough and shortness of breath.   Skin: Negative for rash.  Neurological: Negative for numbness.    Allergies  Penicillins  Home Medications   Current Outpatient Rx  Name  Route  Sig  Dispense  Refill  . LORAZEPAM 0.5 MG PO TABS   Oral   Take 0.25-0.5 mg by mouth daily as needed. For anxiety, rapid heart beat, SOB, abdominal pain, insomnia         . MELATONIN 10 MG PO CAPS   Oral   Take 1 tablet by mouth at bedtime as needed. For insomnia           BP 109/72  Pulse 58  Temp 98.5 F (36.9 C) (Oral)  Resp 16  SpO2 100%  LMP 08/03/2012  Physical Exam  Nursing note and vitals reviewed. Constitutional: She is oriented to person, place, and time. She appears well-developed and well-nourished. No distress.  HENT:  Head: Atraumatic.  Eyes: Conjunctivae normal are normal.  Neck: Neck supple.  Pulmonary/Chest:       L breast: a 3mm papular skin growth noted to middle of nipple, non pedunculated, no discharge, no rash, firm on exam.    Breast  without peau d'orange, no other mass noted.  No nipple inversion.  No discharge.    Chaperone present  Neurological: She is alert and oriented to person, place, and time.  Skin: Skin is warm. No rash noted.    ED Course  Procedures (including critical care time)  Labs Reviewed - No data to display No results found.   No diagnosis found.  1. Nipple mass, L  MDM  Pt has a small, black pepper size nodule to L nipple without evidence of infection.  No other significant finding on breast exam.  Will give referral to breast center for mammography as needed.  Return precaution discussed.   BP 109/72  Pulse 58  Temp 98.5 F (36.9 C) (Oral)  Resp 16  SpO2 100%  LMP 08/03/2012       Fayrene Helper, PA-C 08/05/12 [phone]

## 2012-08-05 NOTE — ED Notes (Signed)
Pt stated her left nipple began to sting tonight and believes it is growing in size.  Pt denies pain and stinging at this time. Pt stated she does not believe she has been bitten or had any other irritants to her breast.

## 2012-08-14 ENCOUNTER — Ambulatory Visit (INDEPENDENT_AMBULATORY_CARE_PROVIDER_SITE_OTHER): Payer: Managed Care, Other (non HMO) | Admitting: Physician Assistant

## 2012-08-14 VITALS — BP 96/70 | HR 62 | Temp 98.1°F | Resp 18 | Ht 66.0 in | Wt 125.0 lb

## 2012-08-14 DIAGNOSIS — R05 Cough: Secondary | ICD-10-CM

## 2012-08-14 DIAGNOSIS — J111 Influenza due to unidentified influenza virus with other respiratory manifestations: Secondary | ICD-10-CM

## 2012-08-14 DIAGNOSIS — R059 Cough, unspecified: Secondary | ICD-10-CM

## 2012-08-14 MED ORDER — BENZONATATE 100 MG PO CAPS: 100.0000 mg | ORAL_CAPSULE | Freq: Three times a day (TID) | ORAL | Status: DC | PRN

## 2012-08-14 MED ORDER — HYDROCODONE-HOMATROPINE 5-1.5 MG/5ML PO SYRP: 5.0000 mL | ORAL_SOLUTION | Freq: Three times a day (TID) | ORAL | Status: DC | PRN

## 2012-08-14 NOTE — Progress Notes (Signed)
  Subjective:    Patient ID: Amber Copeland, female    DOB: 07/09/1981, 32 y.o.   MRN: [id number]  HPI   Amber Copeland is a 32 yr old female complaining of cough.  States she began feeling sick 6 days ago with "stomach flu and bronchitis".  States at that time she had sore throat, fever, chills, headache, sneezing and coughing.  No GI symptoms but appetite was down.  She feels a lot better today but still feels a little weak and has continued cough.  States she is coughing up some mucus.  Feels a little SOB at times, like when she ran up the stairs yesterday.  Denies wheezing.  Denies history of asthma.  Non-smoker.  Used Robitussin, OTC cold medicine, and ibuprofen.  Does not think Robitussin helped.     Review of Systems  Constitutional: Positive for fatigue. Negative for fever and chills.  HENT: Positive for congestion and rhinorrhea. Negative for ear pain, sore throat and sinus pressure.   Respiratory: Positive for cough and shortness of breath. Negative for wheezing.   Cardiovascular: Negative.   Gastrointestinal: Negative.   Musculoskeletal: Negative.   Skin: Negative.   Neurological: Negative for headaches.       Objective:   Physical Exam  Vitals reviewed. Constitutional: She is oriented to person, place, and time. She appears well-developed and well-nourished. No distress.  HENT:  Head: Normocephalic and atraumatic.  Right Ear: Tympanic membrane and ear canal normal.  Left Ear: Tympanic membrane and ear canal normal.  Mouth/Throat: Uvula is midline, oropharynx is clear and moist and mucous membranes are normal.  Neck: Neck supple.  Cardiovascular: Normal rate, regular rhythm and normal heart sounds.  Exam reveals no gallop and no friction rub.   No murmur heard. Pulmonary/Chest: Effort normal and breath sounds normal. She has no decreased breath sounds. She has no wheezes. She has no rales.  Lymphadenopathy:    She has no cervical adenopathy.  Neurological: She is alert  and oriented to person, place, and time.  Skin: Skin is warm and dry.  Psychiatric: She has a normal mood and affect. Her behavior is normal.      Filed Vitals:   08/14/12 1338  BP: 96/70  Pulse: 62  Temp: 98.1 F (36.7 C)  Resp: 18       Assessment & Plan:   1. Cough  benzonatate (TESSALON) 100 MG capsule, HYDROcodone-homatropine (HYCODAN) 5-1.5 MG/5ML syrup  2. Influenza-like illness      Amber Copeland is a very pleasant 32 yr old female with cough, likely secondary to resolving influenza-like illness.  Flu-like symptoms began 6 days ago.  Pt states symptoms are resolving.  Given this, I have not tested her for flu.  She is afebrile and lungs are CTA today.  Will treat cough with Tessalon and Hycodan.  Encouraged plenty of fluids and rest.  Discussed RTC symptoms including worsening cough or development of fever/chills.  Pt understands and is in agreement with this plan.

## 2012-08-14 NOTE — Patient Instructions (Addendum)
Use Tessalon Perles for cough during the day.  Use Hycodan syrup at night - this will make you sleepy.  Plenty of fluids and rest.  I'm hopeful that you will begin seeing improvement in the next several days.  Please let us know if your cough is worsening, if you develop fever or chills again, or if you develop new symptoms.

## 2012-09-11 ENCOUNTER — Telehealth: Payer: Self-pay | Admitting: Family Medicine

## 2012-09-11 NOTE — Telephone Encounter (Signed)
Another note is open on this

## 2012-09-11 NOTE — Telephone Encounter (Signed)
Patient is checking on a form for her insurance company so that she can get a reduced rate. She faxed it to [employer]. I am faxing it to [employer]. It is due Thurs 09/13/12.

## 2012-09-17 ENCOUNTER — Ambulatory Visit (INDEPENDENT_AMBULATORY_CARE_PROVIDER_SITE_OTHER): Payer: Managed Care, Other (non HMO) | Admitting: Family Medicine

## 2012-09-17 ENCOUNTER — Encounter: Payer: Self-pay | Admitting: Family Medicine

## 2012-09-17 VITALS — BP 118/82 | HR 62 | Temp 98.6°F | Ht 66.0 in | Wt 128.1 lb

## 2012-09-17 DIAGNOSIS — N649 Disorder of breast, unspecified: Secondary | ICD-10-CM

## 2012-09-17 DIAGNOSIS — K5900 Constipation, unspecified: Secondary | ICD-10-CM

## 2012-09-17 HISTORY — DX: Disorder of breast, unspecified: N64.9

## 2012-09-17 HISTORY — DX: Constipation, unspecified: K59.00

## 2012-09-17 NOTE — Patient Instructions (Addendum)
64 oz clear fluids, hi fiber diet Add a probiotic such as Digestive Advantage or a generic Add a fiber supplement such as Metamucil, drink water   For the throat start a Zyrtec/Cetirizine 10 mg daily and flush nose with nasal saline once to twice daily ([place], [place]) And/or consider Zantac 150 mg at bed each for a  Month or together  Constipation, Adult Constipation is when a person has fewer than 3 bowel movements a week; has difficulty having a bowel movement; or has stools that are dry, hard, or larger than normal. As people grow older, constipation is more common. If you try to fix constipation with medicines that make you have a bowel movement (laxatives), the problem may get worse. Long-term laxative use may cause the muscles of the colon to become weak. A low-fiber diet, not taking in enough fluids, and taking certain medicines may make constipation worse. CAUSES   Certain medicines, such as antidepressants, pain medicine, iron supplements, antacids, and water pills.   Certain diseases, such as diabetes, irritable bowel syndrome (IBS), thyroid disease, or depression.   Not drinking enough water.   Not eating enough fiber-rich foods.   Stress or travel.  Lack of physical activity or exercise.  Not going to the restroom when there is the urge to have a bowel movement.  Ignoring the urge to have a bowel movement.  Using laxatives too much. SYMPTOMS   Having fewer than 3 bowel movements a week.   Straining to have a bowel movement.   Having hard, dry, or larger than normal stools.   Feeling full or bloated.   Pain in the lower abdomen.  Not feeling relief after having a bowel movement. DIAGNOSIS  Your caregiver will take a medical history and perform a physical exam. Further testing may be done for severe constipation. Some tests may include:   A barium enema X-ray to examine your rectum, colon, and sometimes, your small intestine.  A sigmoidoscopy to examine  your lower colon.  A colonoscopy to examine your entire colon. TREATMENT  Treatment will depend on the severity of your constipation and what is causing it. Some dietary treatments include drinking more fluids and eating more fiber-rich foods. Lifestyle treatments may include regular exercise. If these diet and lifestyle recommendations do not help, your caregiver may recommend taking over-the-counter laxative medicines to help you have bowel movements. Prescription medicines may be prescribed if over-the-counter medicines do not work.  HOME CARE INSTRUCTIONS   Increase dietary fiber in your diet, such as fruits, vegetables, whole grains, and beans. Limit high-fat and processed sugars in your diet, such as [place] fries, hamburgers, cookies, candies, and soda.   A fiber supplement may be added to your diet if you cannot get enough fiber from foods.   Drink enough fluids to keep your urine clear or pale yellow.   Exercise regularly or as directed by your caregiver.   Go to the restroom when you have the urge to go. Do not hold it.  Only take medicines as directed by your caregiver. Do not take other medicines for constipation without talking to your caregiver first. SEEK IMMEDIATE MEDICAL CARE IF:   You have bright red blood in your stool.   Your constipation lasts for more than 4 days or gets worse.   You have abdominal or rectal pain.   You have thin, pencil-like stools.  You have unexplained weight loss. MAKE SURE YOU:   Understand these instructions.  Will watch your condition.  Will  get help right away if you are not doing well or get worse. Document Released: 04/22/2004 Document Revised: 10/17/2011 Document Reviewed: 06/28/2011 [facility] Patient Information 2013 [place], [place].

## 2012-09-17 NOTE — Assessment & Plan Note (Signed)
Bowels roughly twice a week. Encouraged high-fiber diet and increase fluids. Encouraged increased exercise. Add probiotics and a fiber supplement such as Metamucil daily.If still not moving bowels every 2-3 days comfortably thenadd Peri-Colace 1-2 caps daily and report if no improvement. Report any bloody or tarry stool

## 2012-09-17 NOTE — Progress Notes (Signed)
Patient ID: Amber Copeland, female   DOB: 25-Aug-1980, 32 y.o.   MRN: [id number] Amber Copeland [id number] [birth year] 09/17/2012      Progress Note-Follow Up  Subjective  Chief Complaint  Chief Complaint  Patient presents with  . Breast Mass    lump on left breast (nipple)- sometimes tender X 2-3 months    HPI   patient is a 31 year old American female who is in today complaining of a 2-3 month history of lesion on her left breast. It is notable right nipple and is tender at times. No dischargeor itching is noted. Occasionally she thinks there is some mild erythema. Denies any similar symptoms in the right breast. She denies any other chest pain, palpitations, shortness of breath, anorexia, malaise. She has had some low-grade trouble with constipation but this is ongoing. He says she typically moves her bowels about twice a week. He really has to strain but occasionally does strain and once noted a scant amount of blood on the tissue. No melanotic stool. No abdominal pain, nausea vomiting. No other acute complaints at today's visit.  Past Medical History  Diagnosis Date  . Irregular heartbeat 2011    cardiologist put pt on heart monitor for a month and diagnosed w/ irregular heartbeat  . Palpitations 01/19/2011  . Headache disorder 01/19/2011  . Hyperlipidemia 01/19/2011  . Abdominal pain 01/19/2011  . Anxiety and depression 04/25/2011  . Allergy   . Anxiety   . Breast lesion 09/17/2012    left  . Unspecified constipation 09/17/2012    Past Surgical History  Procedure Laterality Date  . Induced abortion  11-2007    Family History  Problem Relation Age of Onset  . Other Mother     vericose veins  . Kidney failure Father     kidney transplant-2003  . Heart murmur Sister   . Other Maternal Grandmother     knee surgery  . Other Paternal Grandmother     vericose veins  . Cancer Paternal Grandfather   . Cancer Maternal Aunt 7    breast    History   Social History   . Marital Status: Single    Spouse Name: N/A    Number of Children: N/A  . Years of Education: N/A   Occupational History  . Not on file.   Social History Main Topics  . Smoking status: Never Smoker   . Smokeless tobacco: Never Used  . Alcohol Use: Yes     Comment: occasionally  . Drug Use: No  . Sexually Active: Yes -- Female partner(s)    Birth Control/ Protection: Condom   Other Topics Concern  . Not on file   Social History Narrative  . No narrative on file    Current Outpatient Prescriptions on File Prior to Visit  Medication Sig Dispense Refill  . LORazepam (ATIVAN) 0.5 MG tablet Take 0.25-0.5 mg by mouth daily as needed. For anxiety, rapid heart beat, SOB, abdominal pain, insomnia       No current facility-administered medications on file prior to visit.    Allergies  Allergen Reactions  . Penicillins Rash    Review of Systems  Review of Systems  Constitutional: Negative for fever and malaise/fatigue.  HENT: Negative for congestion.   Eyes: Negative for discharge.  Respiratory: Negative for shortness of breath.   Cardiovascular: Negative for chest pain, palpitations and leg swelling.  Gastrointestinal: Positive for constipation. Negative for nausea, abdominal pain and diarrhea.  Genitourinary: Negative for dysuria.  Musculoskeletal: Negative  for falls.  Skin: Negative for rash.  Neurological: Negative for loss of consciousness and headaches.  Endo/Heme/Allergies: Negative for polydipsia.  Psychiatric/Behavioral: Negative for depression and suicidal ideas. The patient is not nervous/anxious and does not have insomnia.     Objective  BP 118/82  Pulse 62  Temp(Src) 98.6 F (37 C) (Oral)  Ht 5\' 6"  (1.676 m)  Wt 128 lb 1.9 oz (58.115 kg)  BMI 20.69 kg/m2  SpO2 99%  LMP 09/03/2012  Physical Exam  Physical Exam  Constitutional: She is oriented to person, place, and time and well-developed, well-nourished, and in no distress. No distress.  HENT:   Head: Normocephalic and atraumatic.  Eyes: Conjunctivae are normal.  Neck: Neck supple. No thyromegaly present.  Cardiovascular: Normal rate, regular rhythm and normal heart sounds.   No murmur heard. Pulmonary/Chest: Effort normal and breath sounds normal. She has no wheezes.  Abdominal: She exhibits no distension and no mass.  Musculoskeletal: She exhibits no edema.  Lymphadenopathy:    She has no cervical adenopathy.  Neurological: She is alert and oriented to person, place, and time.  Skin: Skin is warm and dry. No rash noted. She is not diaphoretic.  Psychiatric: Memory, affect and judgment normal.    Lab Results  Component Value Date   TSH 0.87 02/03/2012   Lab Results  Component Value Date   WBC 3.2* 06/18/2012   HGB 12.2 06/18/2012   HCT 37.0 06/18/2012   MCV 92 06/18/2012   PLT 217 06/18/2012   Lab Results  Component Value Date   CREATININE 0.7 02/03/2012   BUN 9 02/03/2012   NA 137 02/03/2012   K 3.8 02/03/2012   CL 104 02/03/2012   CO2 26 02/03/2012   Lab Results  Component Value Date   ALT 12 02/03/2012   AST 18 02/03/2012   ALKPHOS 42 02/03/2012   BILITOT 0.9 02/03/2012   Lab Results  Component Value Date   CHOL 187 02/03/2012   Lab Results  Component Value Date   HDL 74.10 02/03/2012   Lab Results  Component Value Date   LDLCALC 105* 02/03/2012   Lab Results  Component Value Date   TRIG 41.0 02/03/2012   Lab Results  Component Value Date   CHOLHDL 3 02/03/2012     Assessment & Plan  Breast lesion Over a couple of months this patient has been persistent. It is tender. Has a lesion on the tip of her nipple but she also has some skin changes and tenderness in that. Will refer her for diagnostic mammogram at this time and reevaluate once this is completed.  Unspecified constipation Bowels roughly twice a week. Encouraged high-fiber diet and increase fluids. Encouraged increased exercise. Add probiotics and a fiber supplement such as Metamucil  daily.If still not moving bowels every 2-3 days comfortably thenadd Peri-Colace 1-2 caps daily and report if no improvement. Report any bloody or tarry stool

## 2012-09-17 NOTE — Assessment & Plan Note (Signed)
Over a couple of months this patient has been persistent. It is tender. Has a lesion on the tip of her nipple but she also has some skin changes and tenderness in that. Will refer her for diagnostic mammogram at this time and reevaluate once this is completed.

## 2012-09-28 ENCOUNTER — Other Ambulatory Visit: Payer: Self-pay | Admitting: Family Medicine

## 2012-09-28 ENCOUNTER — Ambulatory Visit
Admission: RE | Admit: 2012-09-28 | Discharge: 2012-09-28 | Disposition: A | Discharge status: Home / Self Care | Payer: Managed Care, Other (non HMO) | Source: Ambulatory Visit | Attending: Family Medicine | Admitting: Family Medicine

## 2012-10-26 ENCOUNTER — Ambulatory Visit (INDEPENDENT_AMBULATORY_CARE_PROVIDER_SITE_OTHER): Payer: Self-pay | Admitting: General Surgery

## 2012-11-05 ENCOUNTER — Encounter (INDEPENDENT_AMBULATORY_CARE_PROVIDER_SITE_OTHER): Payer: Self-pay | Admitting: General Surgery

## 2012-12-17 ENCOUNTER — Telehealth: Payer: Self-pay | Admitting: Hematology & Oncology

## 2012-12-17 ENCOUNTER — Other Ambulatory Visit: Payer: Managed Care, Other (non HMO) | Admitting: Lab

## 2012-12-17 ENCOUNTER — Ambulatory Visit: Payer: Managed Care, Other (non HMO) | Admitting: Hematology & Oncology

## 2012-12-17 NOTE — Telephone Encounter (Signed)
Left pt message to call and reschedule missed 5-12 appointment

## 2012-12-18 ENCOUNTER — Telehealth: Payer: Self-pay | Admitting: Hematology & Oncology

## 2012-12-18 NOTE — Telephone Encounter (Signed)
Pt made 5-23 appointment

## 2012-12-28 ENCOUNTER — Other Ambulatory Visit (HOSPITAL_BASED_OUTPATIENT_CLINIC_OR_DEPARTMENT_OTHER): Payer: Managed Care, Other (non HMO) | Admitting: Lab

## 2012-12-28 ENCOUNTER — Ambulatory Visit (HOSPITAL_BASED_OUTPATIENT_CLINIC_OR_DEPARTMENT_OTHER): Payer: Managed Care, Other (non HMO) | Admitting: Hematology & Oncology

## 2012-12-28 VITALS — BP 123/74 | HR 72 | Temp 98.4°F | Resp 16 | Ht 65.0 in | Wt 128.0 lb

## 2012-12-28 DIAGNOSIS — D72819 Decreased white blood cell count, unspecified: Secondary | ICD-10-CM

## 2012-12-28 LAB — CBC WITH DIFFERENTIAL (CANCER CENTER ONLY)
BASO%: 0.3 % (ref 0.0–2.0)
Eosinophils Absolute: 0.1 10*3/uL (ref 0.0–0.5)
MCH: 30.4 pg (ref 26.0–34.0)
MONO#: 0.4 10*3/uL (ref 0.1–0.9)
MONO%: 9.2 % (ref 0.0–13.0)
NEUT#: 1.2 10*3/uL — ABNORMAL LOW (ref 1.5–6.5)
Platelets: 216 10*3/uL (ref 145–400)
RBC: 4.25 10*6/uL (ref 3.70–5.32)
RDW: 12.6 % (ref 11.1–15.7)
WBC: 3.8 10*3/uL — ABNORMAL LOW (ref 3.9–10.0)

## 2012-12-28 LAB — CHCC SATELLITE - SMEAR

## 2012-12-28 NOTE — Progress Notes (Signed)
This office note has been dictated.

## 2012-12-29 NOTE — Progress Notes (Signed)
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  CURRENT THERAPY:  Observation.  INTERIM HISTORY:  Amber Copeland comes in for her followup.  She is doing quite well.  She has had no complaints since we last saw her.  We see her every 6 months.  She was supposed to be married by now but her father-in-law, who is to do the wedding, got injured and was hospitalized.  She has had no issues with infections.  She has had no rashes.  She has had no nausea or vomiting.  She has had no fever, sweats or chills.  PHYSICAL EXAMINATION:  General:  This is a well-developed, well- nourished African American female in no obvious distress.  Vital signs: Show a temperature of 98.4, pulse 72, respiratory rate 16, blood pressure 123/74.  Weight is 128.  Head and neck:  Shows a normocephalic, atraumatic skull.  There are no ocular or oral lesions.  There are no palpable cervical or supraclavicular lymph nodes.  Lungs:  Clear bilaterally.  Cardiac:  Regular rate and rhythm with a normal S1, S2. There are no murmurs, rubs or bruits.  Abdomen:  Soft with good bowel sounds.  There is no palpable abdominal mass.  There is no fluid wave. There is no palpable hepatosplenomegaly.  Extremities:  Show no clubbing, cyanosis or edema.  Neurological:  Shows no focal neurological deficits.  LABORATORY STUDIES:  White cell count 3.8, hemoglobin 12.9, hematocrit 38.9, platelet count 216.  IMPRESSION:  Amber Copeland is a very charming 32 year old African American female with leukopenia.  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  This is a "newer" entity which is being described in African Americans.  The key for Amber Copeland is the fact that she has a reverse ratio of neutrophils and lymphocytes which is highly characteristic of ethnic associated leukopenia.  I believe that her white cell count always will be on the low side.  It will tend to  fluctuate.  She is not at any increased risk for infection.  I think we can let her go from the practice.  I will be more than happy to see her again in the future if there is a need for this.    ______________________________ Josph Macho, M.D. PRE/MEDQ  D:  12/28/2012  T:  12/29/2012  Job:  [id number]

## 2013-02-18 ENCOUNTER — Encounter: Payer: Self-pay | Admitting: Nurse Practitioner

## 2013-02-18 ENCOUNTER — Ambulatory Visit (INDEPENDENT_AMBULATORY_CARE_PROVIDER_SITE_OTHER): Payer: Managed Care, Other (non HMO) | Admitting: Nurse Practitioner

## 2013-02-18 VITALS — BP 102/60 | HR 60 | Temp 98.1°F | Ht 66.0 in | Wt 127.0 lb

## 2013-02-18 DIAGNOSIS — N898 Other specified noninflammatory disorders of vagina: Secondary | ICD-10-CM

## 2013-02-18 DIAGNOSIS — N949 Unspecified condition associated with female genital organs and menstrual cycle: Secondary | ICD-10-CM

## 2013-02-18 DIAGNOSIS — N926 Irregular menstruation, unspecified: Secondary | ICD-10-CM

## 2013-02-18 LAB — URINALYSIS, ROUTINE W REFLEX MICROSCOPIC
Hgb urine dipstick: NEGATIVE
Ketones, ur: NEGATIVE
Leukocytes, UA: NEGATIVE
RBC / HPF: NONE SEEN (ref 0–?)
Specific Gravity, Urine: >=1.03 (ref 1.000–1.030)
Urine Glucose: NEGATIVE
Urobilinogen, UA: 0.2 (ref 0.0–1.0)

## 2013-02-18 LAB — POCT URINE PREGNANCY: Preg Test, Ur: NEGATIVE

## 2013-02-18 NOTE — Patient Instructions (Signed)
I think you have an overgrowth of vaginal yeast. Buy monistat 3 day or 7 day treatment-get kit with suppositories and cream for topical (apply on outside labia) use. If yeast is recurrent, take 1000mg  vit C twice daily. This will ACIDIFY BODY SECRETIONS, WHICH INHIBITS BACTERIAL GROWTH. Take for several weeks. Skin that touches your vagina should be washed first (hands), as we have yeast on our skin naturally.  If you need further treatment based on tests, we will call you.  Monilial Vaginitis Vaginitis in a soreness, swelling and redness (inflammation) of the vagina and vulva. Monilial vaginitis is not a sexually transmitted infection. CAUSES  Yeast vaginitis is caused by yeast (candida) that is normally found in your vagina. With a yeast infection, the candida has overgrown in number to a point that upsets the chemical balance. SYMPTOMS   White, thick vaginal discharge.  Swelling, itching, redness and irritation of the vagina and possibly the lips of the vagina (vulva).  Burning or painful urination.  Painful intercourse. DIAGNOSIS  Things that may contribute to monilial vaginitis are:  Postmenopausal and virginal states.  Pregnancy.  Infections.  Being tired, sick or stressed, especially if you had monilial vaginitis in the past.  Diabetes. Good control will help lower the chance.  Birth control pills.  Tight fitting garments.  Using bubble bath, feminine sprays, douches or deodorant tampons.  Taking certain medications that kill germs (antibiotics).  Sporadic recurrence can occur if you become ill. TREATMENT  Your caregiver will give you medication.  There are several kinds of anti monilial vaginal creams and suppositories specific for monilial vaginitis. For recurrent yeast infections, use a suppository or cream in the vagina 2 times a week, or as directed.  Anti-monilial or steroid cream for the itching or irritation of the vulva may also be used. Get your caregiver's  permission.  Painting the vagina with methylene blue solution may help if the monilial cream does not work.  Eating yogurt may help prevent monilial vaginitis. HOME CARE INSTRUCTIONS   Finish all medication as prescribed.  Do not have sex until treatment is completed or after your caregiver tells you it is okay.  Take warm sitz baths.  Do not douche.  Do not use tampons, especially scented ones.  Wear cotton underwear.  Avoid tight pants and panty hose.  Tell your sexual partner that you have a yeast infection. They should go to their caregiver if they have symptoms such as mild rash or itching.  Your sexual partner should be treated as well if your infection is difficult to eliminate.  Practice safer sex. Use condoms.  Some vaginal medications cause latex condoms to fail. Vaginal medications that harm condoms are:  Cleocin cream.  Butoconazole (Femstat).  Terconazole (Terazol) vaginal suppository.  Miconazole (Monistat) (may be purchased over the counter). SEEK MEDICAL CARE IF:   You have a temperature by mouth above 102 F (38.9 C).  The infection is getting worse after 2 days of treatment.  The infection is not getting better after 3 days of treatment.  You develop blisters in or around your vagina.  You develop vaginal bleeding, and it is not your menstrual period.  You have pain when you urinate.  You develop intestinal problems.  You have pain with sexual intercourse. Document Released: 05/04/2005 Document Revised: 10/17/2011 Document Reviewed: 01/16/2009 [facility] Patient Information 2014 [place], [place].

## 2013-02-18 NOTE — Progress Notes (Signed)
  Subjective:    Patient ID: Amber Copeland, female    DOB: [birth year], 32 y.o.   MRN: [id number]  Vaginal Discharge The patient's primary symptoms include a genital odor, a missed menses (thinks few days late), pelvic pain (occasional low abdominal pain) and a vaginal discharge (white, curd-like). The patient's pertinent negatives include no genital itching, genital lesions, genital rash or vaginal bleeding. This is a recurrent problem. The current episode started 1 to 4 weeks ago. The problem has been gradually improving. The pain is mild. The problem affects both sides. She is not pregnant. Associated symptoms include abdominal pain (mild pain low abdomen, intermittent) and flank pain. Pertinent negatives include no anorexia, back pain, chills, constipation, diarrhea, discolored urine, dysuria, fever, frequency, headaches, hematuria, joint pain, joint swelling, nausea, painful intercourse, rash, sore throat, urgency or vomiting. The vaginal discharge was white, thick and malodorous. There has been no bleeding. She has not been passing clots. She has not been passing tissue. Nothing aggravates the symptoms. She has tried nothing for the symptoms. She is sexually active. No, her partner does not have an STD. She uses condoms (withdrawal) for contraception. Her menstrual history has been regular. There is no history of an abdominal surgery, menorrhagia, PID or an STD.      Review of Systems  Constitutional: Negative for fever, chills, activity change and fatigue.  HENT: Negative for sore throat.   Gastrointestinal: Positive for abdominal pain (mild pain low abdomen, intermittent). Negative for nausea, vomiting, diarrhea, constipation and anorexia.  Genitourinary: Positive for flank pain, vaginal discharge (white, curd-like), menstrual problem (thinks few days late), pelvic pain (occasional low abdominal pain) and missed menses (thinks few days late). Negative for dysuria, urgency, frequency,  hematuria, vaginal bleeding and menorrhagia.  Musculoskeletal: Negative for back pain and joint pain.  Skin: Negative for rash.  Neurological: Negative for headaches.       Objective:   Physical Exam  Vitals reviewed. Constitutional: She is oriented to person, place, and time. She appears well-developed and well-nourished. No distress.  HENT:  Head: Normocephalic and atraumatic.  Eyes: Conjunctivae are normal. Right eye exhibits no discharge. Left eye exhibits no discharge.  Cardiovascular: Normal rate.   Pulmonary/Chest: Effort normal.  Abdominal: Soft. She exhibits no distension and no mass. There is no tenderness. There is no rebound and no guarding.  Genitourinary:    Cervix exhibits discharge (beige creamy). Cervix exhibits no friability. No tenderness or bleeding around the vagina. Vaginal discharge (white, curd-like) found.  Musculoskeletal:  Normal gait  Lymphadenopathy:       Right: No inguinal adenopathy present.       Left: No inguinal adenopathy present.  Neurological: She is alert and oriented to person, place, and time.  Skin: Skin is warm and dry.  Psychiatric: She has a normal mood and affect. Her behavior is normal. Thought content normal.          Assessment & Plan:  1. Vaginal discharge Likely yeast overgrowth - WET PREP FOR TRICH, YEAST, CLUE - GC probe amplification, urine - Urinalysis, Routine w reflex microscopic See pt instructions. 2. Late menses - POCT urine pregnancy- neg

## 2013-02-19 LAB — CHLAMYDIA TRACHOMATIS, PROBE AMP: CT Probe RNA: NEGATIVE

## 2013-02-19 LAB — WET PREP BY MOLECULAR PROBE: Candida species: POSITIVE — AB

## 2013-02-20 ENCOUNTER — Telehealth: Payer: Self-pay | Admitting: *Deleted

## 2013-02-20 ENCOUNTER — Ambulatory Visit: Payer: Managed Care, Other (non HMO) | Admitting: Internal Medicine

## 2013-02-20 ENCOUNTER — Encounter: Payer: Self-pay | Admitting: Family Medicine

## 2013-02-20 NOTE — Telephone Encounter (Signed)
Please advise? I informed pt that you are out of the office today and that she will most likely need an appt but I would pass this on to MD

## 2013-02-20 NOTE — Telephone Encounter (Signed)
Error

## 2013-02-22 ENCOUNTER — Ambulatory Visit (INDEPENDENT_AMBULATORY_CARE_PROVIDER_SITE_OTHER): Payer: Managed Care, Other (non HMO) | Admitting: Family

## 2013-02-22 ENCOUNTER — Encounter: Payer: Self-pay | Admitting: Family

## 2013-02-22 VITALS — BP 122/77 | HR 69 | Temp 97.9°F | Resp 16 | Wt 125.1 lb

## 2013-02-22 DIAGNOSIS — F341 Dysthymic disorder: Secondary | ICD-10-CM

## 2013-02-22 DIAGNOSIS — F418 Other specified anxiety disorders: Secondary | ICD-10-CM

## 2013-02-22 DIAGNOSIS — F419 Anxiety disorder, unspecified: Secondary | ICD-10-CM

## 2013-02-22 DIAGNOSIS — F329 Major depressive disorder, single episode, unspecified: Secondary | ICD-10-CM

## 2013-02-22 MED ORDER — CITALOPRAM HYDROBROMIDE 20 MG PO TABS: ORAL_TABLET | ORAL | Status: DC

## 2013-02-22 NOTE — Assessment & Plan Note (Signed)
Deteriorated.  Trial of citalopram.  I instructed pt to start 1/2 tablet once daily for 1 week and then increase to a full tablet once daily on week two as tolerated.  We discussed common side effects such as nausea, drowsiness and weight gain.  Also discussed rare but serious side effect of suicide ideation.  She is instructed to discontinue medication go directly to ED if this occurs.  Pt verbalizes understanding.  Will also refer for counseling as I think this will benefit her considerably. We did discuss use of condoms while on this medication as it would not be ideal to continue this medication during pregnancy. She verbalizes understanding.  Plan follow up in 1 month to evaluate progress.

## 2013-02-22 NOTE — Progress Notes (Signed)
  Subjective:    Patient ID: Amber Copeland, female    DOB: 03/18/1981, 32 y.o.   MRN: [id number]  HPI  Ms. Amber Copeland is a 32 yr old female who presents today to discuss anxiety and depression.  Reports that symptoms started >1 yr ago.  Sad all the time  Works at [employer] [place].  New position at work.  Scores at work are low due to extreme stress.  Reports poor appetite. Stays in the bed, doesn't want to shower or go out with friends.  Family, friends, husband have all noticed.     Review of Systems    see HPI  Past Medical History  Diagnosis Date  . Irregular heartbeat 2011    cardiologist put pt on heart monitor for a month and diagnosed w/ irregular heartbeat  . Palpitations 01/19/2011  . Headache disorder 01/19/2011  . Hyperlipidemia 01/19/2011  . Abdominal pain 01/19/2011  . Anxiety and depression 04/25/2011  . Allergy   . Anxiety   . Breast lesion 09/17/2012    left  . Unspecified constipation 09/17/2012    History   Social History  . Marital Status: Single    Spouse Name: N/A    Number of Children: N/A  . Years of Education: N/A   Occupational History  . Not on file.   Social History Main Topics  . Smoking status: Never Smoker   . Smokeless tobacco: Never Used  . Alcohol Use: Yes     Comment: occasionally  . Drug Use: No  . Sexually Active: Yes -- Female partner(s)    Birth Control/ Protection: Condom   Other Topics Concern  . Not on file   Social History Narrative  . No narrative on file    Past Surgical History  Procedure Laterality Date  . Induced abortion  11-2007    Family History  Problem Relation Age of Onset  . Other Mother     vericose veins  . Kidney failure Father     kidney transplant-2003  . Heart murmur Sister   . Other Maternal Grandmother     knee surgery  . Other Paternal Grandmother     vericose veins  . Cancer Paternal Grandfather   . Cancer Maternal Aunt 42    breast    Allergies  Allergen Reactions  .  Penicillins Rash    Current Outpatient Prescriptions on File Prior to Visit  Medication Sig Dispense Refill  . Biotin 1 MG CAPS Take by mouth as needed.      Marland Kitchen LORazepam (ATIVAN) 0.5 MG tablet Take 0.25-0.5 mg by mouth daily as needed. For anxiety, rapid heart beat, SOB, abdominal pain, insomnia       No current facility-administered medications on file prior to visit.    BP 122/77  Pulse 69  Temp(Src) 97.9 F (36.6 C) (Oral)  Resp 16  Wt 125 lb 1.3 oz (56.736 kg)  BMI 20.2 kg/m2  SpO2 99%  LMP 02/19/2013    Objective:   Physical Exam  Constitutional: She is oriented to person, place, and time. She appears well-developed and well-nourished. No distress.  Neurological: She is alert and oriented to person, place, and time.  Psychiatric:  Tearful, flat affect, mildly anxious appearing          Assessment & Plan:  20 minutes spent with pt today.  >50% of this time was spent counseling pt on her anxiety and depression.

## 2013-02-22 NOTE — Patient Instructions (Addendum)
Follow up in 1 month with Dr. Abner Greenspan.

## 2013-02-25 ENCOUNTER — Encounter: Payer: Self-pay | Admitting: Family

## 2013-03-05 ENCOUNTER — Ambulatory Visit (INDEPENDENT_AMBULATORY_CARE_PROVIDER_SITE_OTHER): Payer: Managed Care, Other (non HMO) | Admitting: Licensed Clinical Social Worker

## 2013-03-05 DIAGNOSIS — F321 Major depressive disorder, single episode, moderate: Secondary | ICD-10-CM

## 2013-03-07 ENCOUNTER — Encounter: Payer: Self-pay | Admitting: Family

## 2013-03-07 ENCOUNTER — Encounter: Payer: Self-pay | Admitting: Family Medicine

## 2013-03-08 ENCOUNTER — Encounter: Payer: Self-pay | Admitting: Family

## 2013-03-08 NOTE — Telephone Encounter (Signed)
Please see additional mychart message from 03/08/13. Pt has deadline for Wednesday and is requesting office notes be faxed as well.

## 2013-03-11 ENCOUNTER — Telehealth: Payer: Self-pay | Admitting: Family

## 2013-03-11 NOTE — Telephone Encounter (Addendum)
I spoke with pt and let her know that her paperwork is ready for pick up.  She tells me that her last day of work was 02/15/13.   Please call pt and arrange a follow up appointment with Dr. Abner Greenspan on 7/14 or 7/15.

## 2013-03-12 NOTE — Telephone Encounter (Signed)
Patient scheduled follow up with Dr. Abner Greenspan for 03/21/13

## 2013-03-12 NOTE — Telephone Encounter (Signed)
See 03/11/13 phone note, form was placed at front desk for pick up.

## 2013-03-14 ENCOUNTER — Ambulatory Visit (INDEPENDENT_AMBULATORY_CARE_PROVIDER_SITE_OTHER): Payer: Managed Care, Other (non HMO) | Admitting: Licensed Clinical Social Worker

## 2013-03-14 DIAGNOSIS — F321 Major depressive disorder, single episode, moderate: Secondary | ICD-10-CM

## 2013-03-21 ENCOUNTER — Ambulatory Visit (INDEPENDENT_AMBULATORY_CARE_PROVIDER_SITE_OTHER): Payer: Managed Care, Other (non HMO) | Admitting: Family Medicine

## 2013-03-21 ENCOUNTER — Ambulatory Visit (INDEPENDENT_AMBULATORY_CARE_PROVIDER_SITE_OTHER): Payer: Managed Care, Other (non HMO) | Admitting: Licensed Clinical Social Worker

## 2013-03-21 ENCOUNTER — Encounter: Payer: Self-pay | Admitting: Family Medicine

## 2013-03-21 VITALS — BP 102/62 | HR 68 | Temp 98.2°F | Ht 66.0 in | Wt 129.0 lb

## 2013-03-21 DIAGNOSIS — F419 Anxiety disorder, unspecified: Secondary | ICD-10-CM

## 2013-03-21 DIAGNOSIS — R5383 Other fatigue: Secondary | ICD-10-CM

## 2013-03-21 DIAGNOSIS — F341 Dysthymic disorder: Secondary | ICD-10-CM

## 2013-03-21 DIAGNOSIS — F321 Major depressive disorder, single episode, moderate: Secondary | ICD-10-CM

## 2013-03-21 DIAGNOSIS — N760 Acute vaginitis: Secondary | ICD-10-CM

## 2013-03-21 DIAGNOSIS — D72819 Decreased white blood cell count, unspecified: Secondary | ICD-10-CM

## 2013-03-21 DIAGNOSIS — F418 Other specified anxiety disorders: Secondary | ICD-10-CM

## 2013-03-21 DIAGNOSIS — R5381 Other malaise: Secondary | ICD-10-CM

## 2013-03-21 MED ORDER — CITALOPRAM HYDROBROMIDE 20 MG PO TABS: ORAL_TABLET | ORAL | Status: DC

## 2013-03-21 MED ORDER — LORAZEPAM 0.5 MG PO TABS: 0.2500 mg | ORAL_TABLET | Freq: Three times a day (TID) | ORAL | Status: DC | PRN

## 2013-03-21 NOTE — Patient Instructions (Addendum)

## 2013-03-22 ENCOUNTER — Encounter: Payer: Self-pay | Admitting: Family Medicine

## 2013-03-22 NOTE — Telephone Encounter (Signed)
Don't we have to receive paperwork from the company letterhead stating this?

## 2013-03-24 ENCOUNTER — Encounter: Payer: Self-pay | Admitting: Family Medicine

## 2013-03-24 NOTE — Progress Notes (Signed)
Patient ID: Amber Copeland, female   DOB: [birth year], 32 y.o.   MRN: [id number] Amber Copeland [id number] 1981-03-04 03/24/2013      Progress Note-Follow Up  Subjective  Chief Complaint  Chief Complaint  Patient presents with  . Follow-up    HPI  Patient is a 32 year old female who is in today for reevaluation of her severe anxiety and depression. She reports she is in intensive counseling on her medications and feels they're helping somewhat. She still struggles with irritability and lability. Denies suicidal ideation but does believe she is tolerating the meds. Denies any GI upset. Denies any headaches or malaise. She's having trouble concentrating and doing her job adequately. Is also threatened kidney.  Past Medical History  Diagnosis Date  . Irregular heartbeat 2011    cardiologist put pt on heart monitor for a month and diagnosed w/ irregular heartbeat  . Palpitations 01/19/2011  . Headache disorder 01/19/2011  . Hyperlipidemia 01/19/2011  . Abdominal pain 01/19/2011  . Anxiety and depression 04/25/2011  . Allergy   . Anxiety   . Breast lesion 09/17/2012    left  . Unspecified constipation 09/17/2012    Past Surgical History  Procedure Laterality Date  . Induced abortion  11-2007    Family History  Problem Relation Age of Onset  . Other Mother     vericose veins  . Kidney failure Father     kidney transplant-2003  . Heart murmur Sister   . Other Maternal Grandmother     knee surgery  . Other Paternal Grandmother     vericose veins  . Cancer Paternal Grandfather   . Cancer Maternal Aunt 28    breast    History   Social History  . Marital Status: Single    Spouse Name: N/A    Number of Children: N/A  . Years of Education: N/A   Occupational History  . Not on file.   Social History Main Topics  . Smoking status: Never Smoker   . Smokeless tobacco: Never Used  . Alcohol Use: Yes     Comment: occasionally  . Drug Use: No  . Sexual Activity: Yes   Partners: Male    Birth Control/ Protection: Condom   Other Topics Concern  . Not on file   Social History Narrative  . No narrative on file    Current Outpatient Prescriptions on File Prior to Visit  Medication Sig Dispense Refill  . Biotin 1 MG CAPS Take by mouth as needed.       No current facility-administered medications on file prior to visit.    Allergies  Allergen Reactions  . Penicillins Rash    Review of Systems  Review of Systems  Constitutional: Negative for fever and malaise/fatigue.  HENT: Negative for congestion.   Eyes: Negative for discharge.  Respiratory: Negative for shortness of breath.   Cardiovascular: Negative for chest pain, palpitations and leg swelling.  Gastrointestinal: Negative for nausea, abdominal pain and diarrhea.  Genitourinary: Negative for dysuria.  Musculoskeletal: Negative for falls.  Skin: Negative for rash.  Neurological: Negative for loss of consciousness and headaches.  Endo/Heme/Allergies: Negative for polydipsia.  Psychiatric/Behavioral: Positive for depression. Negative for suicidal ideas. The patient is nervous/anxious. The patient does not have insomnia.     Objective  BP 102/62  Pulse 68  Temp(Src) 98.2 F (36.8 C) (Oral)  Ht 5\' 6"  (1.676 m)  Wt 129 lb (58.514 kg)  BMI 20.83 kg/m2  SpO2 99%  LMP 02/18/2013  Physical  Exam  Physical Exam  Constitutional: She is oriented to person, place, and time and well-developed, well-nourished, and in no distress. No distress.  HENT:  Head: Normocephalic and atraumatic.  Eyes: Conjunctivae are normal.  Neck: Neck supple. No thyromegaly present.  Cardiovascular: Normal rate, regular rhythm and normal heart sounds.   No murmur heard. Pulmonary/Chest: Effort normal and breath sounds normal. She has no wheezes.  Abdominal: She exhibits no distension and no mass.  Musculoskeletal: She exhibits no edema.  Lymphadenopathy:    She has no cervical adenopathy.  Neurological: She  is alert and oriented to person, place, and time.  Skin: Skin is warm and dry. No rash noted. She is not diaphoretic.  Psychiatric: Memory, affect and judgment normal.    Lab Results  Component Value Date   TSH 0.87 02/03/2012   Lab Results  Component Value Date   WBC 3.8* 12/28/2012   HGB 12.9 12/28/2012   HCT 38.9 12/28/2012   MCV 92 12/28/2012   PLT 216 12/28/2012   Lab Results  Component Value Date   CREATININE 0.7 02/03/2012   BUN 9 02/03/2012   NA 137 02/03/2012   K 3.8 02/03/2012   CL 104 02/03/2012   CO2 26 02/03/2012   Lab Results  Component Value Date   ALT 12 02/03/2012   AST 18 02/03/2012   ALKPHOS 42 02/03/2012   BILITOT 0.9 02/03/2012   Lab Results  Component Value Date   CHOL 187 02/03/2012   Lab Results  Component Value Date   HDL 74.10 02/03/2012   Lab Results  Component Value Date   LDLCALC 105* 02/03/2012   Lab Results  Component Value Date   TRIG 41.0 02/03/2012   Lab Results  Component Value Date   CHOLHDL 3 02/03/2012     Assessment & Plan  Anxiety and depression Recently worsened. He is in counseling and is helpful but she is very stressful to [facility] and interpreted by her ex-boyfriend. He did not do well for long on Sertraline in the past but it seems to be tolerating Celexa. Continue the same period he may use lorazepam when necessary. Spent 25 minutes of a 30 minute visit in counseling

## 2013-03-24 NOTE — Assessment & Plan Note (Addendum)
Recently worsened. He is in counseling and is helpful but she is very stressful to [facility] and interpreted by her ex-boyfriend. He did not do well for long on Sertraline in the past but it seems to be tolerating Celexa. Continue the same period he may use lorazepam when necessary. Spent 25 minutes of a 30 minute visit in counseling

## 2013-03-26 ENCOUNTER — Other Ambulatory Visit: Payer: Self-pay | Admitting: Family Medicine

## 2013-03-26 DIAGNOSIS — F329 Major depressive disorder, single episode, unspecified: Secondary | ICD-10-CM

## 2013-03-26 NOTE — Telephone Encounter (Signed)
FYI: Pt is not ready to return to work- does not feel emotionally ready. She sent a message in [employer] you can look at. Paperwork on MD's desk

## 2013-03-31 ENCOUNTER — Encounter: Payer: Self-pay | Admitting: Family Medicine

## 2013-04-01 ENCOUNTER — Telehealth: Payer: Self-pay

## 2013-04-01 NOTE — Telephone Encounter (Signed)
Message copied by Court Joy on Mon Apr 01, 2013 10:58 AM ------      Message from: Darral Dash E      Created: Mon Apr 01, 2013 10:18 AM       Lorain Childes,   Patient has appt with , Berniece Andreas , @ [facility] tomorrow, appointment with [facility],Outpatient Dr Karie Fetch, Sept 17, pt requesting female, she will call for that appt ------

## 2013-04-02 ENCOUNTER — Ambulatory Visit: Payer: Managed Care, Other (non HMO) | Admitting: Licensed Clinical Social Worker

## 2013-04-04 NOTE — Telephone Encounter (Signed)
Please advise 

## 2013-04-06 ENCOUNTER — Other Ambulatory Visit: Payer: Self-pay | Admitting: Family Medicine

## 2013-04-09 NOTE — Telephone Encounter (Signed)
Refill is not appropriate. Last RX was done on 03-21-13 quantity 40 with 2 refills.

## 2013-04-11 ENCOUNTER — Ambulatory Visit (INDEPENDENT_AMBULATORY_CARE_PROVIDER_SITE_OTHER): Payer: Managed Care, Other (non HMO) | Admitting: Licensed Clinical Social Worker

## 2013-04-11 DIAGNOSIS — F321 Major depressive disorder, single episode, moderate: Secondary | ICD-10-CM

## 2013-04-24 ENCOUNTER — Ambulatory Visit (HOSPITAL_COMMUNITY): Payer: Managed Care, Other (non HMO) | Admitting: Psychiatry

## 2013-04-25 ENCOUNTER — Ambulatory Visit: Payer: Managed Care, Other (non HMO) | Admitting: Licensed Clinical Social Worker

## 2013-04-29 ENCOUNTER — Ambulatory Visit: Payer: Managed Care, Other (non HMO) | Admitting: Family Medicine

## 2013-05-07 ENCOUNTER — Telehealth: Payer: Self-pay

## 2013-05-07 NOTE — Telephone Encounter (Signed)
Please advise mychart

## 2013-05-07 NOTE — Telephone Encounter (Signed)
Per md:  I feel like she is going to get in trouble with her job and insurance if she does not see psych asap. They will not usually accept just a PMD note at this time. Given that the only way I can even consider extending her paperwork is if I see her to document all of her concerns. This is for her benefit. She needs an appt   Left a message for patient to return my call

## 2013-05-07 NOTE — Telephone Encounter (Signed)
Per MD: I feel like she is going to get in trouble with her job and insurance if she does not see psych asap. They will not usually accept just a PMD note at this time. Given that the only way I can even consider extending her paperwork is if I see her to document all of her concerns. This is for her benefit. She needs an appt.  I tried to call pt. LMOVM

## 2013-05-08 NOTE — Telephone Encounter (Signed)
See other note

## 2013-05-09 NOTE — Telephone Encounter (Signed)
FYI

## 2013-06-13 ENCOUNTER — Other Ambulatory Visit: Payer: Self-pay

## 2013-08-31 IMAGING — US US PELVIS COMPLETE
1 series · 14 of 25 positions shown · non-contrast
Comparison: None

CLINICAL DATA: Pelvic pain and irregular menses.

TRANSABDOMINAL AND TRANSVAGINAL ULTRASOUND OF PELVIS
TECHNIQUE: Both transabdominal and transvaginal ultrasound
examinations of the pelvis were performed. Transabdominal technique
was performed for global imaging of the pelvis including uterus,
ovaries, adnexal regions, and pelvic cul-de-sac.

[Series 1: us pelvis complete · 0.24mm/px · 14 of 99 slices shown]
[im 1/99]
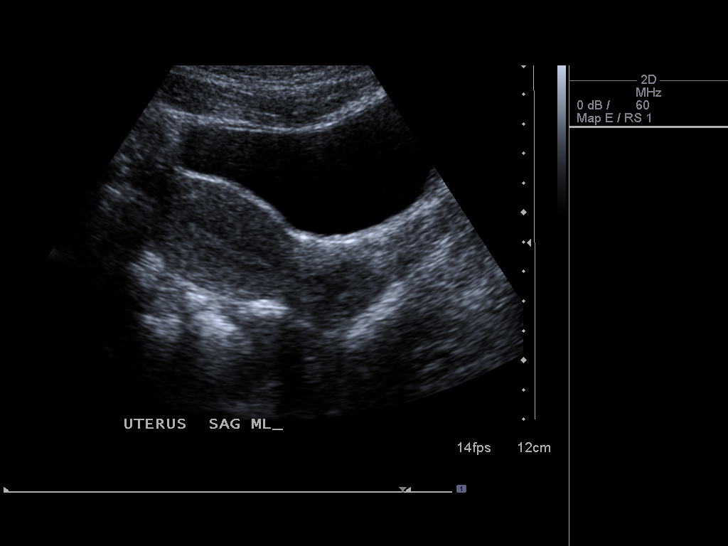
[im 9/99]
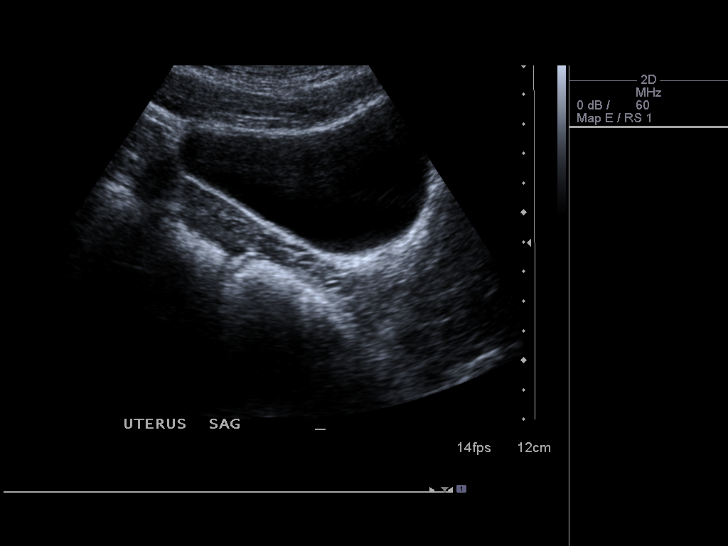
[im 17/99]
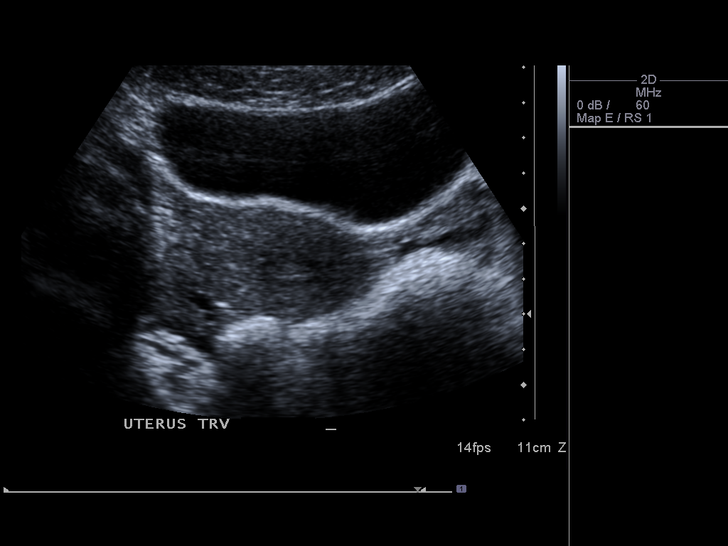
[im 25/99]
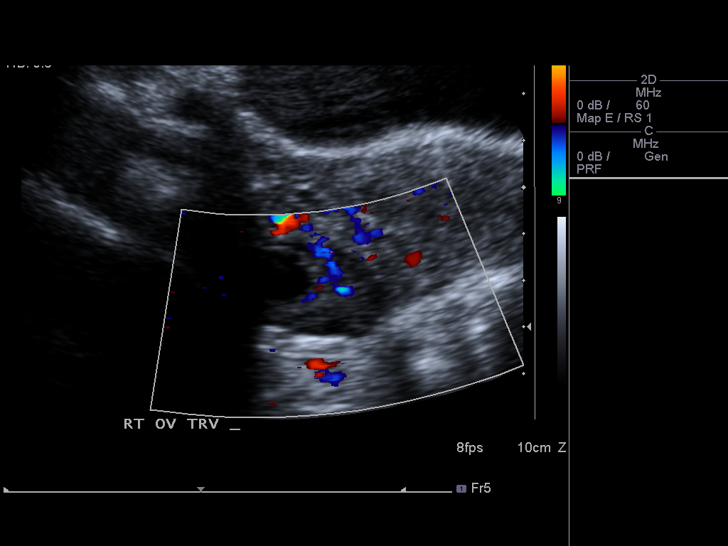
[im 33/99]
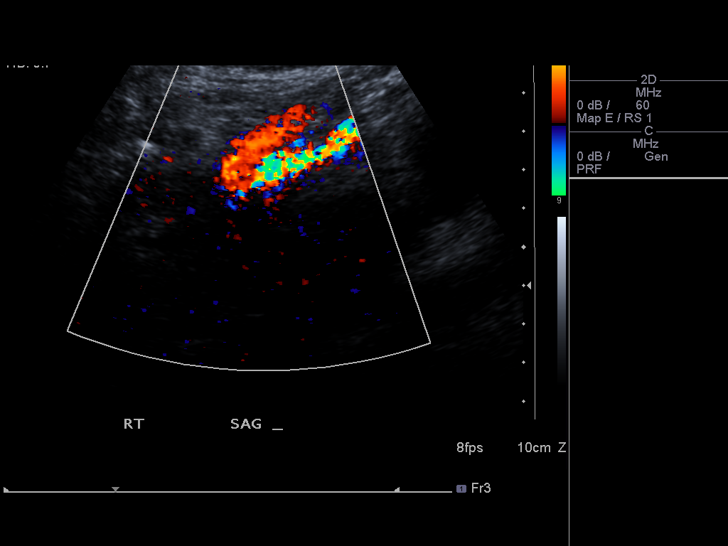
[im 37/99]
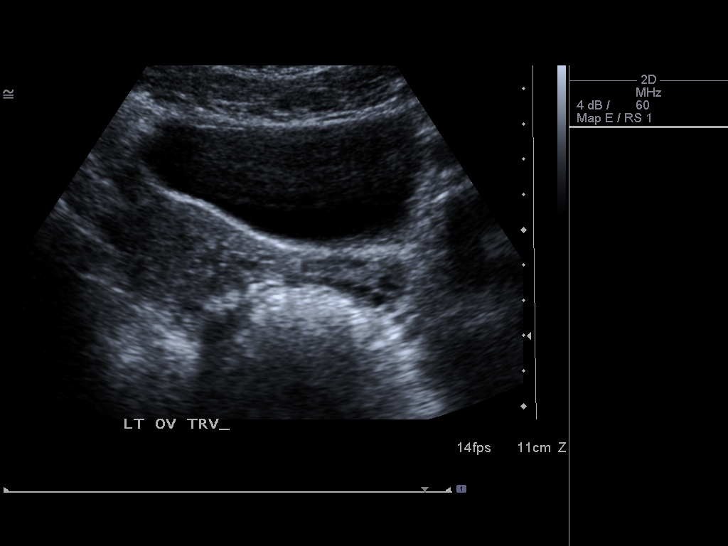
[im 45/99]
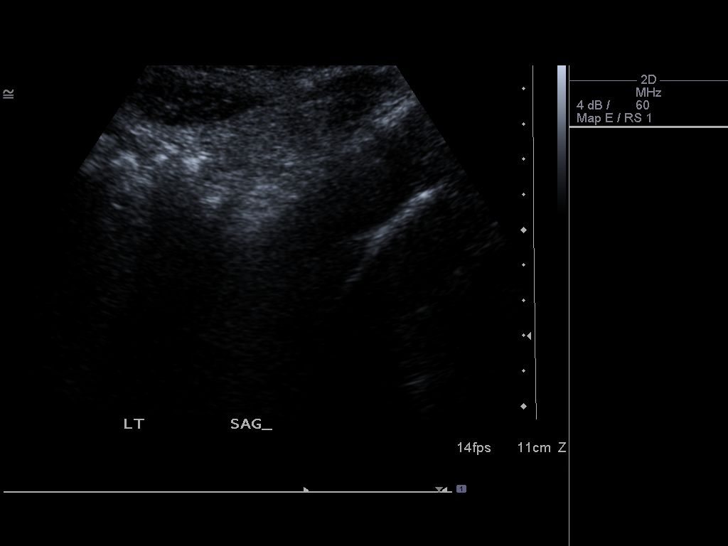
[im 54/99]
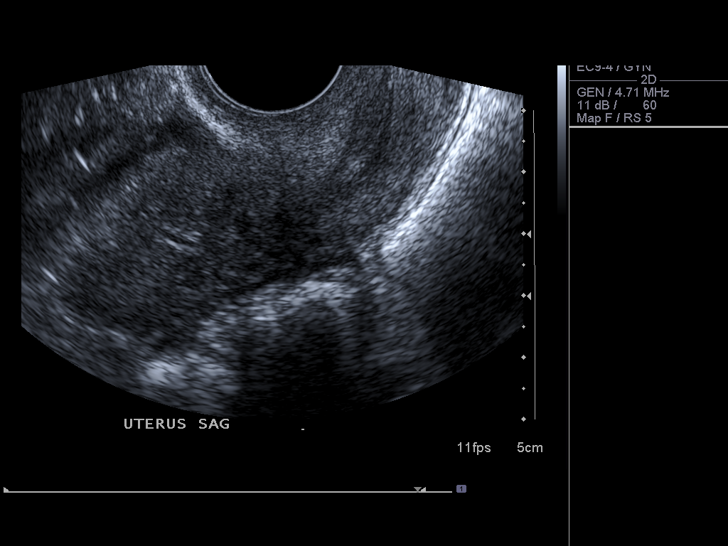
[im 62/99]
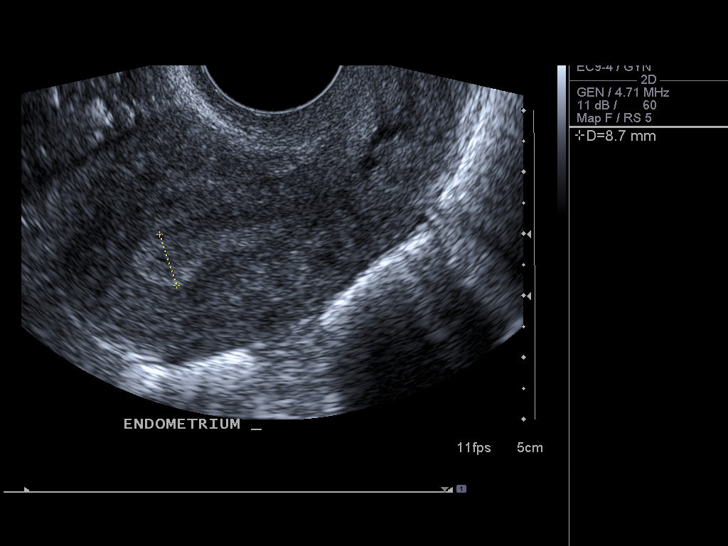
[im 66/99]
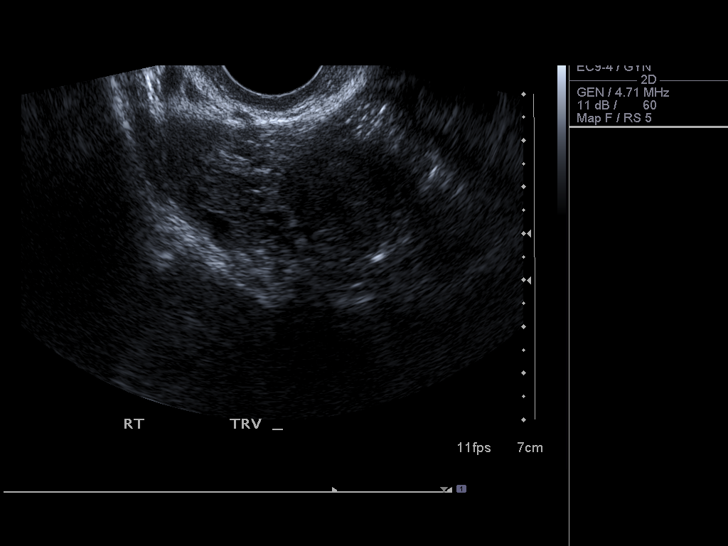
[im 74/99]
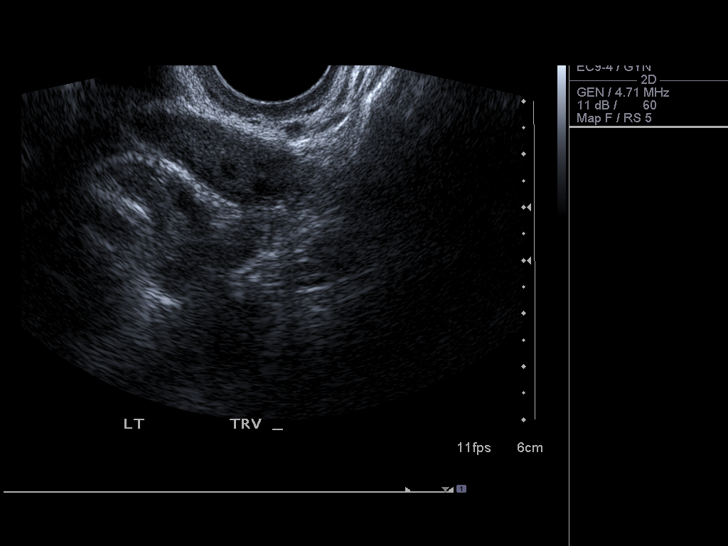
[im 82/99]
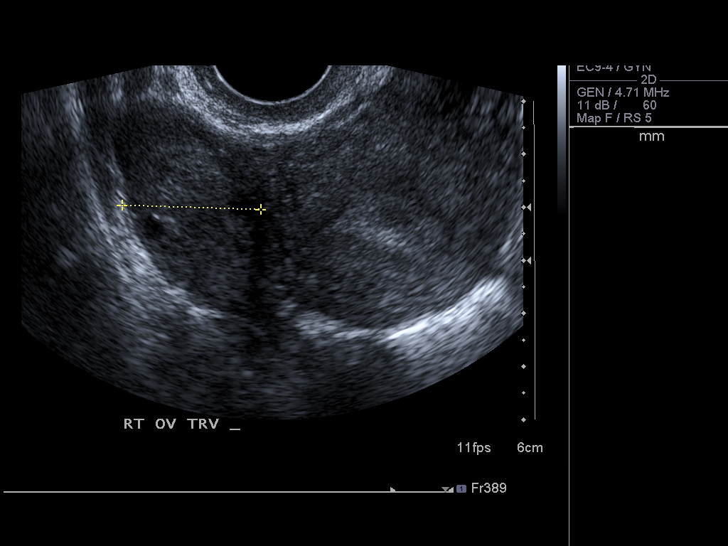
[im 90/99]
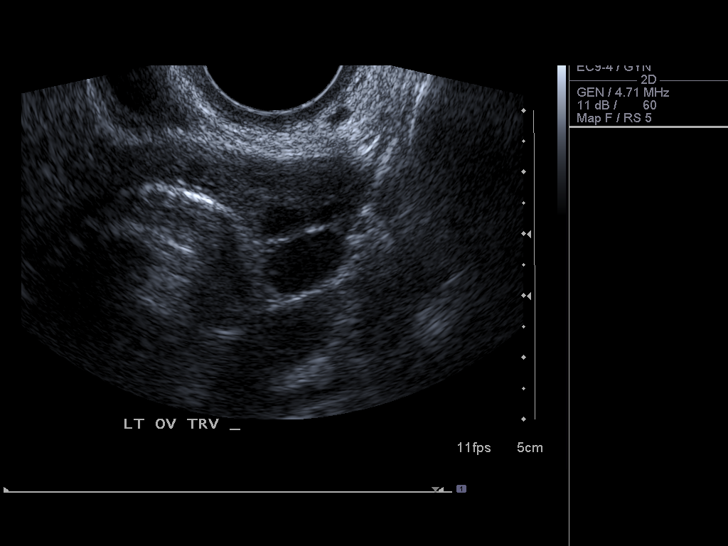
[im 99/99]
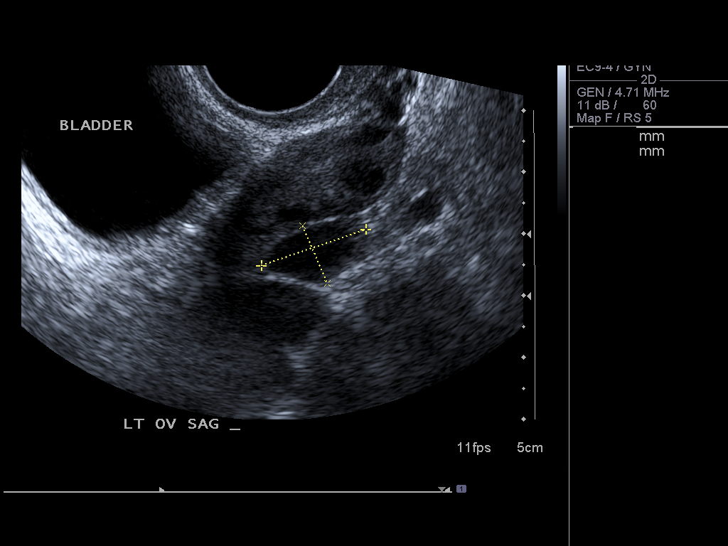

[14 of 25 positions shown; findings below may reference images not displayed]

It was necessary to proceed with endovaginal exam following the
transabdominal exam to visualize the endometrium and ovaries..
FINDINGS: Uterus: Measures 8.4 x 3.5 x 5.7 cm.  No myometrial abnormalities.

Endometrium: Normal in thickness measuring a maximum of 8.7 mm.

Right ovary:  Measures 4.4 x 8-0.8 x 2.6 cm.  No cysts or masses.

Left ovary: Measures 3.1 x 1.4 x 2.5 cm.  A small, 1.8 cm para
ovarian cyst is noted.

Other findings: No free fluid
IMPRESSION: 1.  Normal sonographic appearance of the uterus.
2.  1.8 cm left paraovarian cyst..

## 2013-08-31 IMAGING — US US ABDOMEN COMPLETE
1 series · 14 of 25 positions shown · non-contrast
Comparison: None.

CLINICAL DATA: Abdominal pain.

COMPLETE ABDOMINAL ULTRASOUND

[Series 1: us abdomen complete · 0.30mm/px · 14 of 61 slices shown]
[im 1/61]
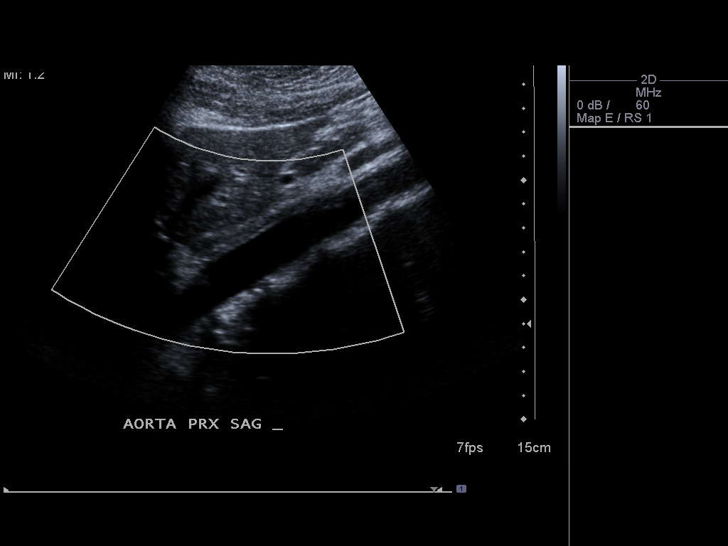
[im 6/61]
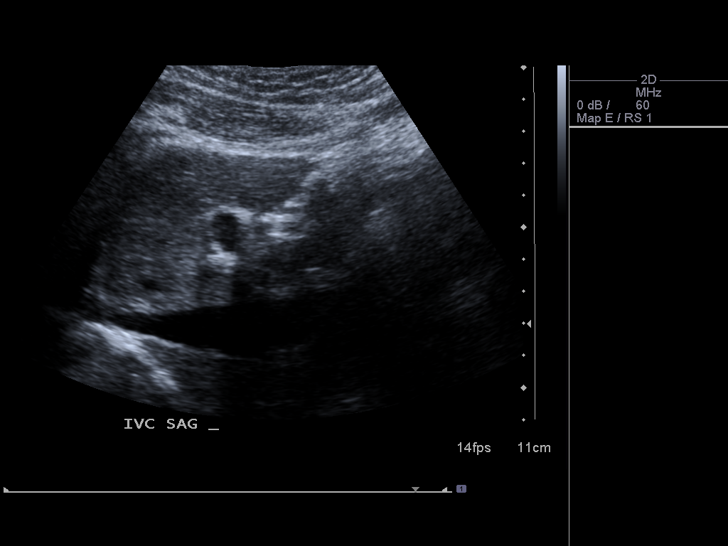
[im 11/61]
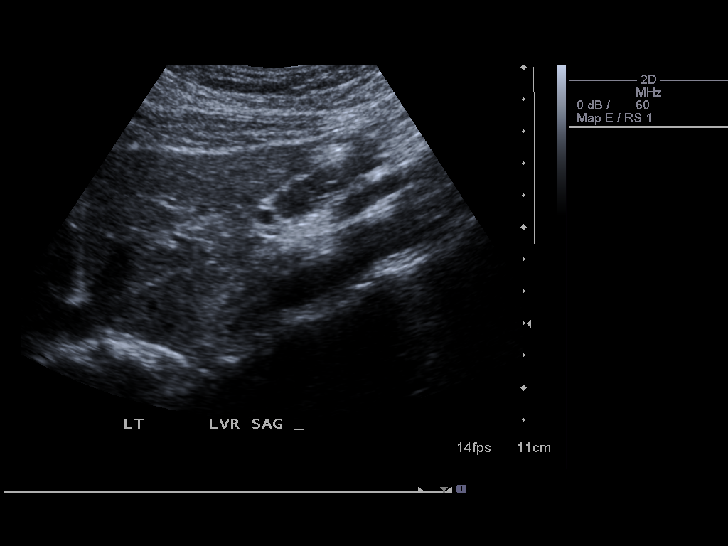
[im 16/61]
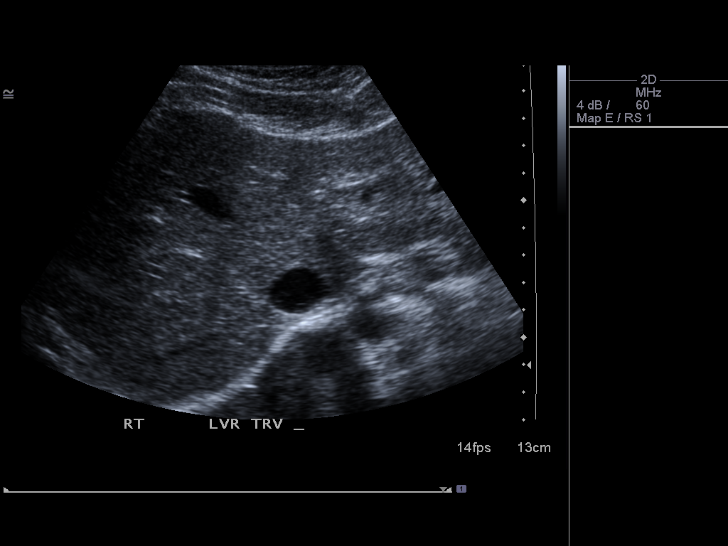
[im 21/61]
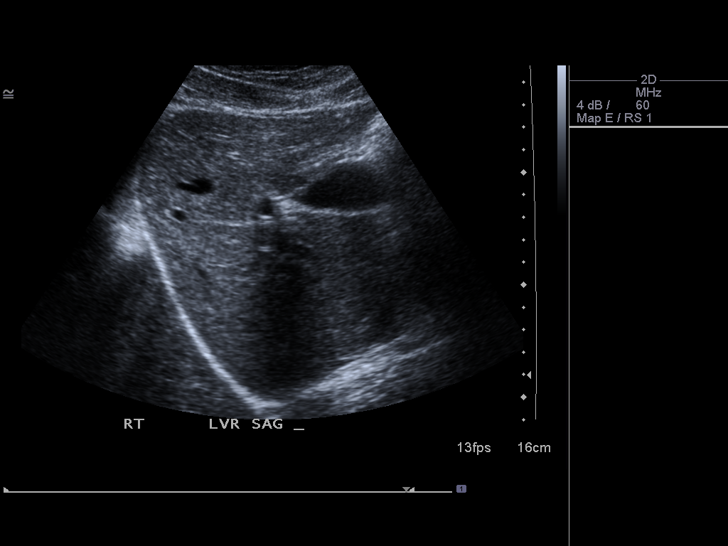
[im 23/61]
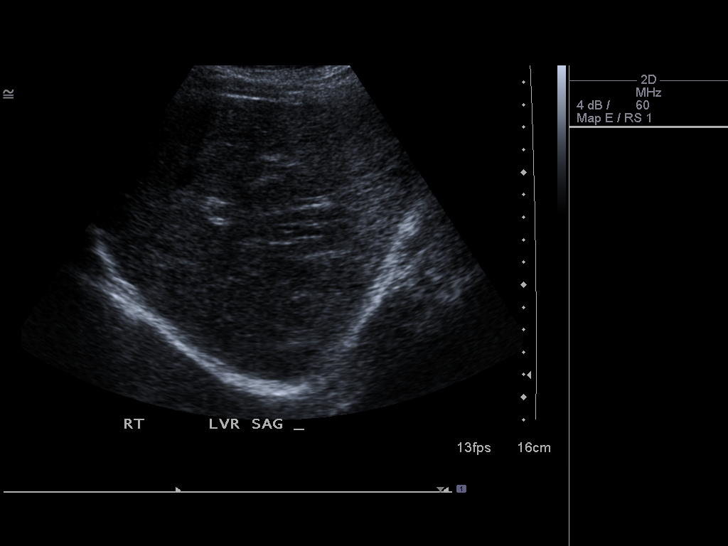
[im 28/61]
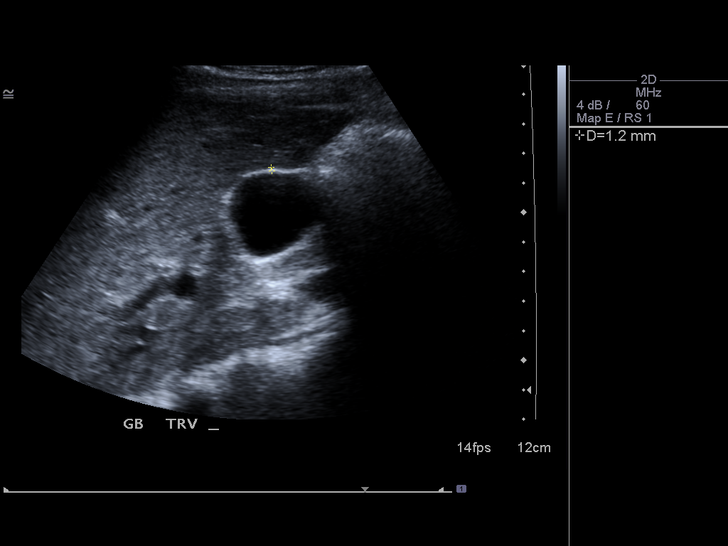
[im 33/61]
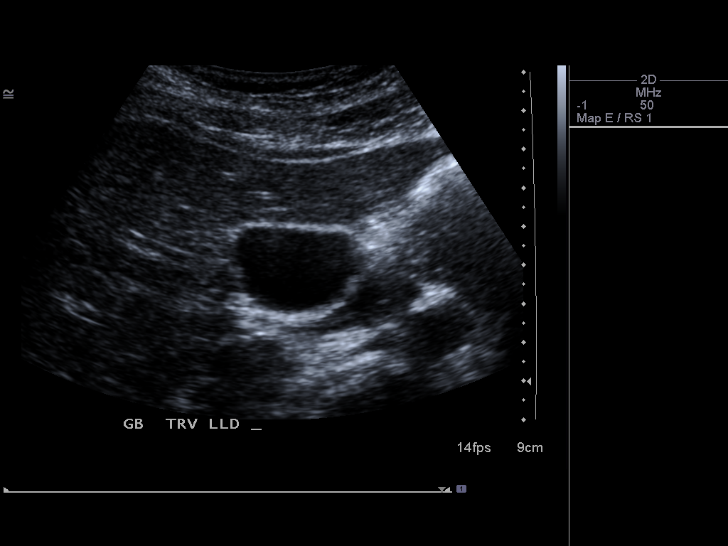
[im 38/61]
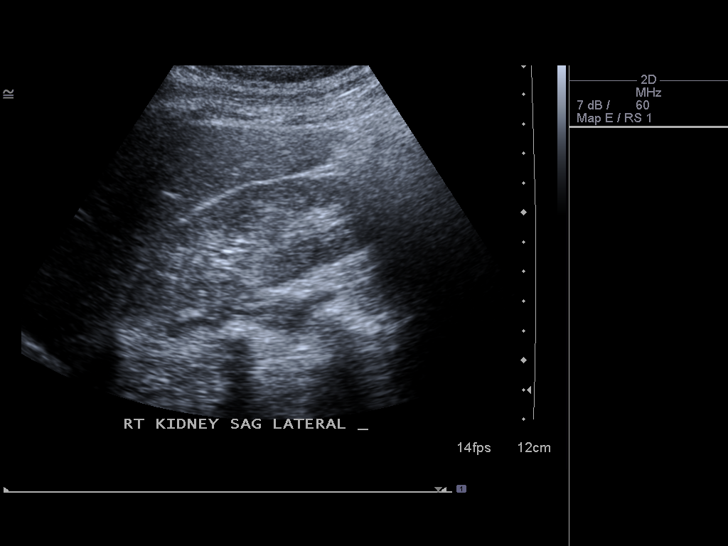
[im 41/61]
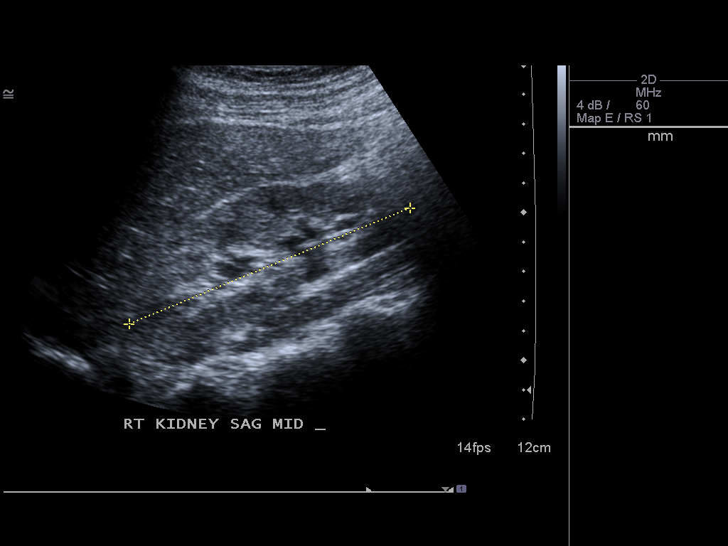
[im 46/61]
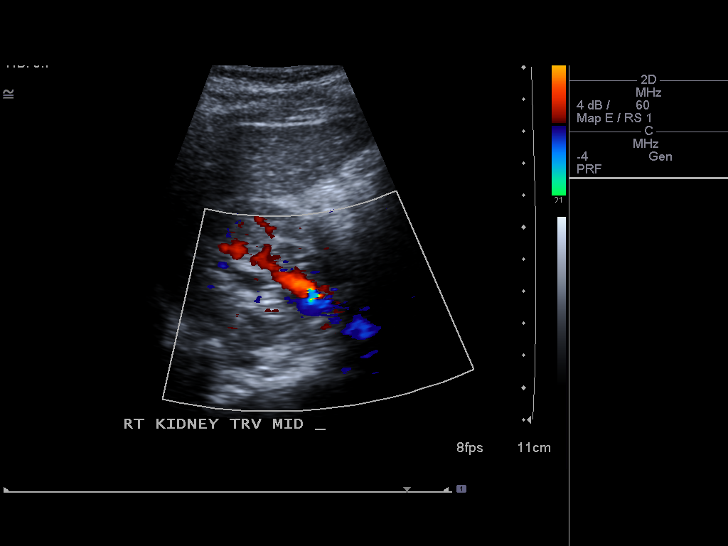
[im 51/61]
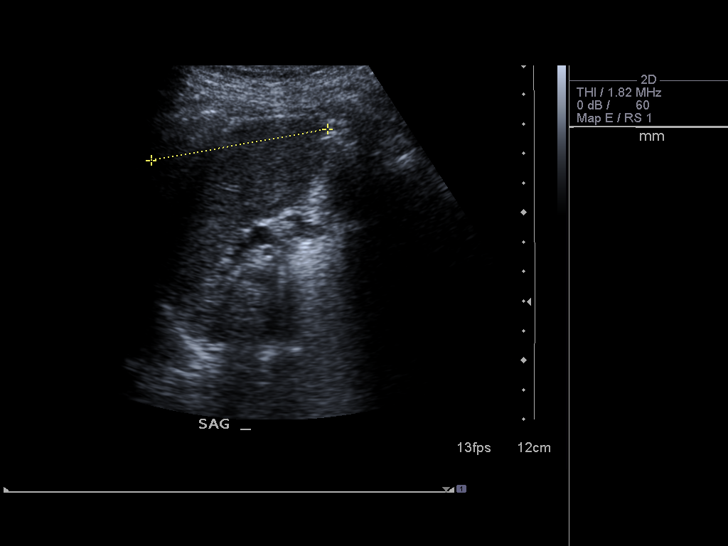
[im 56/61]
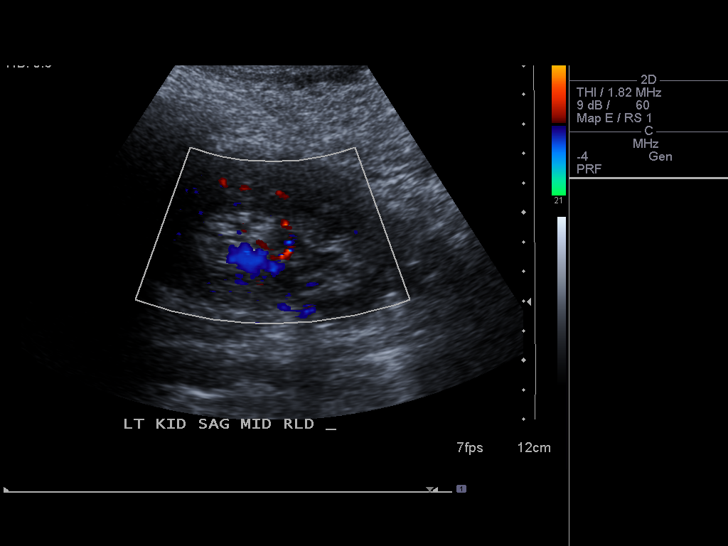
[im 61/61]
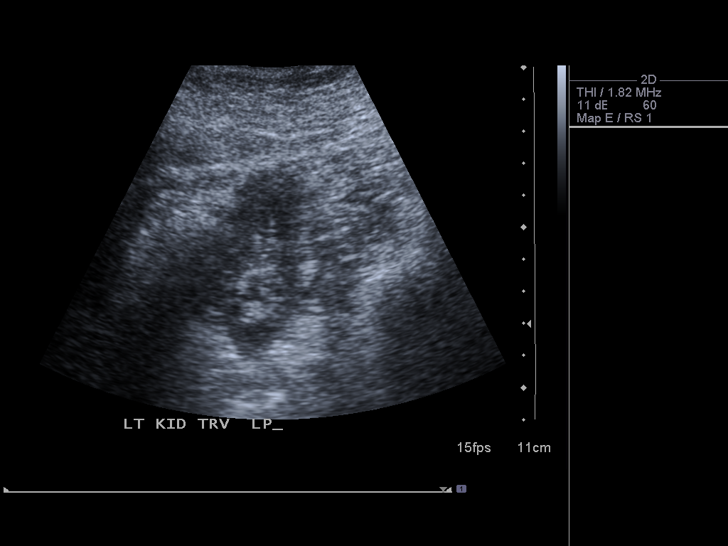

[14 of 25 positions shown; findings below may reference images not displayed]

FINDINGS: Gallbladder:  No gallstones, gallbladder wall thickening, or
pericholecystic fluid.

Common bile duct:  Normal in caliber measuring a maximum of 2.9mm.

Liver:  The liver is sonographically unremarkable.  There is normal
echogenicity without focal lesions or intrahepatic biliary
dilatation.

IVC:  Normal caliber.

Pancreas:  Sonographically unremarkable.

Spleen:  Normal size and echogenicity without focal lesions.

Right Kidney:  10.3 cm in length. Normal renal cortical thickness
and echogenicity without focal lesions or hydronephrosis.

Left Kidney:  10.5 cm in length. Normal renal cortical thickness
and echogenicity without focal lesions or hydronephrosis.

Abdominal aorta:  Normal caliber.
IMPRESSION: Unremarkable abdominal ultrasound examination.

## 2014-05-23 ENCOUNTER — Other Ambulatory Visit: Payer: Self-pay

## 2015-04-10 ENCOUNTER — Encounter (HOSPITAL_COMMUNITY): Payer: Self-pay

## 2015-04-10 ENCOUNTER — Inpatient Hospital Stay (HOSPITAL_COMMUNITY): Payer: Managed Care, Other (non HMO)

## 2015-04-10 ENCOUNTER — Inpatient Hospital Stay (HOSPITAL_COMMUNITY)
Admission: AD | Admit: 2015-04-10 | Discharge: 2015-04-10 | Disposition: A | Discharge status: Home / Self Care | Payer: Managed Care, Other (non HMO) | Source: Ambulatory Visit | Attending: Obstetrics and Gynecology | Admitting: Obstetrics and Gynecology

## 2015-04-10 DIAGNOSIS — Z880 Allergy status to penicillin: Secondary | ICD-10-CM | POA: Diagnosis not present

## 2015-04-10 DIAGNOSIS — R109 Unspecified abdominal pain: Secondary | ICD-10-CM | POA: Diagnosis present

## 2015-04-10 DIAGNOSIS — O99411 Diseases of the circulatory system complicating pregnancy, first trimester: Secondary | ICD-10-CM | POA: Insufficient documentation

## 2015-04-10 DIAGNOSIS — O26851 Spotting complicating pregnancy, first trimester: Secondary | ICD-10-CM

## 2015-04-10 DIAGNOSIS — O26899 Other specified pregnancy related conditions, unspecified trimester: Secondary | ICD-10-CM

## 2015-04-10 DIAGNOSIS — Z3A01 Less than 8 weeks gestation of pregnancy: Secondary | ICD-10-CM | POA: Insufficient documentation

## 2015-04-10 LAB — CBC WITH DIFFERENTIAL/PLATELET
Basophils Absolute: 0 10*3/uL (ref 0.0–0.1)
Basophils Relative: 0 % (ref 0–1)
EOS ABS: 0.1 10*3/uL (ref 0.0–0.7)
EOS PCT: 2 % (ref 0–5)
HCT: 35.6 % — ABNORMAL LOW (ref 36.0–46.0)
Hemoglobin: 11.7 g/dL — ABNORMAL LOW (ref 12.0–15.0)
LYMPHS ABS: 2.8 10*3/uL (ref 0.7–4.0)
Lymphocytes Relative: 59 % — ABNORMAL HIGH (ref 12–46)
MCH: 30.1 pg (ref 26.0–34.0)
MCHC: 32.9 g/dL (ref 30.0–36.0)
MCV: 91.5 fL (ref 78.0–100.0)
MONOS PCT: 9 % (ref 3–12)
Monocytes Absolute: 0.4 10*3/uL (ref 0.1–1.0)
Neutro Abs: 1.5 10*3/uL — ABNORMAL LOW (ref 1.7–7.7)
Neutrophils Relative %: 30 % — ABNORMAL LOW (ref 43–77)
PLATELETS: 215 10*3/uL (ref 150–400)
RBC: 3.89 MIL/uL (ref 3.87–5.11)
RDW: 13.7 % (ref 11.5–15.5)
WBC: 4.8 10*3/uL (ref 4.0–10.5)

## 2015-04-10 LAB — URINALYSIS, ROUTINE W REFLEX MICROSCOPIC
Bilirubin Urine: NEGATIVE
GLUCOSE, UA: NEGATIVE mg/dL
Ketones, ur: NEGATIVE mg/dL
LEUKOCYTES UA: NEGATIVE
Nitrite: NEGATIVE
PH: 6 (ref 5.0–8.0)
Protein, ur: NEGATIVE mg/dL
SPECIFIC GRAVITY, URINE: 1.015 (ref 1.005–1.030)
Urobilinogen, UA: 0.2 mg/dL (ref 0.0–1.0)

## 2015-04-10 LAB — WET PREP, GENITAL
Trich, Wet Prep: NONE SEEN
WBC, Wet Prep HPF POC: NONE SEEN
Yeast Wet Prep HPF POC: NONE SEEN

## 2015-04-10 LAB — URINE MICROSCOPIC-ADD ON

## 2015-04-10 LAB — HCG, QUANTITATIVE, PREGNANCY: hCG, Beta Chain, Quant, S: 109 m[IU]/mL — ABNORMAL HIGH (ref <5)

## 2015-04-10 LAB — POCT PREGNANCY, URINE: Preg Test, Ur: POSITIVE — AB

## 2015-04-10 NOTE — Discharge Instructions (Signed)
First Trimester of Pregnancy The first trimester of pregnancy is from week 1 until the end of week 12 (months 1 through 3). During this time, your baby will begin to develop inside you. At 6-8 weeks, the eyes and face are formed, and the heartbeat can be seen on ultrasound. At the end of 12 weeks, all the baby's organs are formed. Prenatal care is all the medical care you receive before the birth of your baby. Make sure you get good prenatal care and follow all of your doctor's instructions. HOME CARE  Medicines  Take medicine only as told by your doctor. Some medicines are safe and some are not during pregnancy.  Take your prenatal vitamins as told by your doctor.  Take medicine that helps you poop (stool softener) as needed if your doctor says it is okay. Diet  Eat regular, healthy meals.  Your doctor will tell you the amount of weight gain that is right for you.  Avoid raw meat and uncooked cheese.  If you feel sick to your stomach (nauseous) or throw up (vomit):  Eat 4 or 5 small meals a day instead of 3 large meals.  Try eating a few soda crackers.  Drink liquids between meals instead of during meals.  If you have a hard time pooping (constipation):  Eat high-fiber foods like fresh vegetables, fruit, and whole grains.  Drink enough fluids to keep your pee (urine) clear or pale yellow. Activity and Exercise  Exercise only as told by your doctor. Stop exercising if you have cramps or pain in your lower belly (abdomen) or low back.  Try to avoid standing for long periods of time. Move your legs often if you must stand in one place for a long time.  Avoid heavy lifting.  Wear low-heeled shoes. Sit and stand up straight.  You can have sex unless your doctor tells you not to. Relief of Pain or Discomfort  Wear a good support bra if your breasts are sore.  Take warm water baths (sitz baths) to soothe pain or discomfort caused by hemorrhoids. Use hemorrhoid cream if your  doctor says it is okay.  Rest with your legs raised if you have leg cramps or low back pain.  Wear support hose if you have puffy, bulging veins (varicose veins) in your legs. Raise (elevate) your feet for 15 minutes, 3-4 times a day. Limit salt in your diet. Prenatal Care  Schedule your prenatal visits by the twelfth week of pregnancy.  Write down your questions. Take them to your prenatal visits.  Keep all your prenatal visits as told by your doctor. Safety  Wear your seat belt at all times when driving.  Make a list of emergency phone numbers. The list should include numbers for family, friends, the hospital, and police and fire departments. General Tips  Ask your doctor for a referral to a local prenatal class. Begin classes no later than at the start of month 6 of your pregnancy.  Ask for help if you need counseling or help with nutrition. Your doctor can give you advice or tell you where to go for help.  Do not use hot tubs, steam rooms, or saunas.  Do not douche or use tampons or scented sanitary pads.  Do not cross your legs for long periods of time.  Avoid litter boxes and soil used by cats.  Avoid all smoking, herbs, and alcohol. Avoid drugs not approved by your doctor.  Visit your dentist. At home, brush your teeth   with a soft toothbrush. Be gentle when you floss. GET HELP IF:  You are dizzy.  You have mild cramps or pressure in your lower belly.  You have a nagging pain in your belly area.  You continue to feel sick to your stomach, throw up, or have watery poop (diarrhea).  You have a bad smelling fluid coming from your vagina.  You have pain with peeing (urination).  You have increased puffiness (swelling) in your face, hands, legs, or ankles. GET HELP RIGHT AWAY IF:   You have a fever.  You are leaking fluid from your vagina.  You have spotting or bleeding from your vagina.  You have very bad belly cramping or pain.  You gain or lose weight  rapidly.  You throw up blood. It may look like coffee grounds.  You are around people who have German measles, fifth disease, or chickenpox.  You have a very bad headache.  You have shortness of breath.  You have any kind of trauma, such as from a fall or a car accident. Document Released: 01/11/2008 Document Revised: 12/09/2013 Document Reviewed: 06/04/2013 Exit[employer] Patient Information 2015 [employer]. This information is not intended to replace advice given to you by your health care provider. Make sure you discuss any questions you have with your health care provider.  

## 2015-04-10 NOTE — MAU Note (Signed)
Dark bleeding since 1415 and now bright bleeding.  Abd cramping since 1500.

## 2015-04-10 NOTE — Progress Notes (Signed)
Julie Wenzel PA in to discuss test results and d/c plan with pt. Written and verbal d/c instructions given and understanding voiced. 

## 2015-04-10 NOTE — MAU Provider Note (Signed)
History     CSN: [id number]  Arrival date and time: 04/10/15 4098   First Provider Initiated Contact with Patient 04/10/15 2010      Chief Complaint  Patient presents with  . Abdominal Cramping  . Vaginal Bleeding   HPI Amber Copeland is a 34 y.o. G2P0010 at [redacted]w[redacted]d who presents to MAU today with complaint of vaginal bleeding and lower abdominal cramping since this afternoon. She states that blood was dark red at first and now is lighter. She did soak a panty liner, but only has a small amount of her pad now. She states lower abdominal cramping improved since earlier. She rates her pain at 3/10 now. She has not taken anything for pain. She denies clots, fever, UTI symptoms, N/V/D or constipation. She did have recent +HPT and LMP 03/08/15.   OB History    Gravida Para Term Preterm AB TAB SAB Ectopic Multiple Living   2    1           Past Medical History  Diagnosis Date  . Irregular heartbeat 2011    cardiologist put pt on heart monitor for a month and diagnosed w/ irregular heartbeat  . Palpitations 01/19/2011  . Headache disorder 01/19/2011  . Hyperlipidemia 01/19/2011  . Abdominal pain 01/19/2011  . Anxiety and depression 04/25/2011  . Allergy   . Anxiety   . Breast lesion 09/17/2012    left  . Unspecified constipation 09/17/2012    Past Surgical History  Procedure Laterality Date  . Induced abortion  11-2007    Family History  Problem Relation Age of Onset  . Other Mother     vericose veins  . Kidney failure Father     kidney transplant-2003  . Heart murmur Sister   . Other Maternal Grandmother     knee surgery  . Other Paternal Grandmother     vericose veins  . Cancer Paternal Grandfather   . Cancer Maternal Aunt 42    breast    Social History  Substance Use Topics  . Smoking status: Never Smoker   . Smokeless tobacco: Never Used  . Alcohol Use: Yes     Comment: occasionally    Allergies:  Allergies  Allergen Reactions  . Penicillins Rash     Prescriptions prior to admission  Medication Sig Dispense Refill Last Dose  . Multiple Vitamins-Minerals (MULTIVITAMIN PO) Take 1 tablet by mouth daily.   04/10/2015 at Unknown time  . Prenatal Vit-Fe Fumarate-FA (PRENATAL MULTIVITAMIN) TABS tablet Take 1 tablet by mouth daily at 12 noon.   04/10/2015 at Unknown time  . Biotin 1 MG CAPS Take by mouth as needed.   Not Taking at Unknown time  . citalopram (CELEXA) 20 MG tablet 1/2 tab by mouth daily for 1 week. Then increase to a full tablet once daily (Patient not taking: Reported on 04/10/2015) 30 tablet 3   . LORazepam (ATIVAN) 0.5 MG tablet Take 0.5-1 tablets (0.25-0.5 mg total) by mouth every 8 (eight) hours as needed. For anxiety, rapid heart beat, SOB, abdominal pain, insomnia (Patient not taking: Reported on 04/10/2015) 40 tablet 2 Not Taking at Unknown time    Review of Systems  Constitutional: Negative for fever and malaise/fatigue.  Gastrointestinal: Positive for abdominal pain. Negative for nausea, vomiting, diarrhea and constipation.  Genitourinary: Negative for dysuria, urgency and frequency.       Neg - vaginal discharge + vaginal bleeding   Physical Exam   Blood pressure 116/71, pulse 66, temperature 98.2  F (36.8 C), resp. rate 18, height 5\' 6"  (1.676 m), weight 134 lb 12.8 oz (61.145 kg), last menstrual period 03/08/2015.  Physical Exam  Nursing note and vitals reviewed. Constitutional: She is oriented to person, place, and time. She appears well-developed and well-nourished. No distress.  HENT:  Head: Normocephalic and atraumatic.  Cardiovascular: Normal rate.   Respiratory: Effort normal.  GI: Soft. She exhibits no distension and no mass. There is no tenderness. There is no rebound and no guarding.  Genitourinary: Uterus is not enlarged and not tender. Cervix exhibits no motion tenderness, no discharge and no friability. Right adnexum displays no mass and no tenderness. Left adnexum displays no mass and no tenderness.  There is bleeding (scant blood) in the vagina. No vaginal discharge found.  Cervix: closed, thick  Neurological: She is alert and oriented to person, place, and time.  Skin: Skin is warm and dry. No erythema.  Psychiatric: She has a normal mood and affect.   Results for orders placed or performed during the hospital encounter of 04/10/15 (from the past 24 hour(s))  Urinalysis, Routine w reflex microscopic (not at [facility])     Status: Abnormal   Collection Time: 04/10/15  7:29 PM  Result Value Ref Range   Color, Urine YELLOW YELLOW   APPearance CLEAR CLEAR   Specific Gravity, Urine 1.015 1.005 - 1.030   pH 6.0 5.0 - 8.0   Glucose, UA NEGATIVE NEGATIVE mg/dL   Hgb urine dipstick LARGE (A) NEGATIVE   Bilirubin Urine NEGATIVE NEGATIVE   Ketones, ur NEGATIVE NEGATIVE mg/dL   Protein, ur NEGATIVE NEGATIVE mg/dL   Urobilinogen, UA 0.2 0.0 - 1.0 mg/dL   Nitrite NEGATIVE NEGATIVE   Leukocytes, UA NEGATIVE NEGATIVE  Urine microscopic-add on     Status: Abnormal   Collection Time: 04/10/15  7:29 PM  Result Value Ref Range   Squamous Epithelial / LPF RARE RARE   WBC, UA 0-2 <3 WBC/hpf   RBC / HPF 7-10 <3 RBC/hpf   Bacteria, UA FEW (A) RARE  Pregnancy, urine POC     Status: Abnormal   Collection Time: 04/10/15  7:37 PM  Result Value Ref Range   Preg Test, Ur POSITIVE (A) NEGATIVE  Wet prep, genital     Status: Abnormal   Collection Time: 04/10/15  8:15 PM  Result Value Ref Range   Yeast Wet Prep HPF POC NONE SEEN NONE SEEN   Trich, Wet Prep NONE SEEN NONE SEEN   Clue Cells Wet Prep HPF POC FEW (A) NONE SEEN   WBC, Wet Prep HPF POC NONE SEEN NONE SEEN  CBC with Differential/Platelet     Status: Abnormal   Collection Time: 04/10/15  8:18 PM  Result Value Ref Range   WBC 4.8 4.0 - 10.5 K/uL   RBC 3.89 3.87 - 5.11 MIL/uL   Hemoglobin 11.7 (L) 12.0 - 15.0 g/dL   HCT 40.9 (L) 81.1 - 91.4 %   MCV 91.5 78.0 - 100.0 fL   MCH 30.1 26.0 - 34.0 pg   MCHC 32.9 30.0 - 36.0 g/dL   RDW 78.2 95.6 -  21.3 %   Platelets 215 150 - 400 K/uL   Neutrophils Relative % 30 (L) 43 - 77 %   Neutro Abs 1.5 (L) 1.7 - 7.7 K/uL   Lymphocytes Relative 59 (H) 12 - 46 %   Lymphs Abs 2.8 0.7 - 4.0 K/uL   Monocytes Relative 9 3 - 12 %   Monocytes Absolute 0.4 0.1 - 1.0  K/uL   Eosinophils Relative 2 0 - 5 %   Eosinophils Absolute 0.1 0.0 - 0.7 K/uL   Basophils Relative 0 0 - 1 %   Basophils Absolute 0.0 0.0 - 0.1 K/uL  ABO/Rh     Status: None (Preliminary result)   Collection Time: 04/10/15  8:18 PM  Result Value Ref Range   ABO/RH(D) A POS   hCG, quantitative, pregnancy     Status: Abnormal   Collection Time: 04/10/15  8:18 PM  Result Value Ref Range   hCG, Beta Chain, Quant, S 109 (H) <5 mIU/mL   US Ob Comp Less 14 Wks  04/10/2015   CLINICAL DATA:  34 year old pregnant female with abdominal pain. Estimated gestational age of [redacted] weeks 5 days by LMP.  EXAM: OBSTETRIC <14 WK [place] AND TRANSVAGINAL OB US  TECHNIQUE: Both transabdominal and transvaginal ultrasound examinations were performed for complete evaluation of the gestation as well as the maternal uterus, adnexal regions, and pelvic cul-de-sac. Transvaginal technique was performed to assess early pregnancy.  COMPARISON:  None.  FINDINGS: Intrauterine gestational sac: A cystic structure within the central retroverted uterus measures 3.7 mm in mean diameter and probably represents an early gestational sac.  Yolk sac:  Not visualized  Embryo:  Not visualized  Cardiac Activity: Not visualized  MSD: 3.7  mm   5 w   1  d              [place] EDC: 12/10/2015  Maternal uterus/adnexae: The ovaries bilaterally are unremarkable.  There is no evidence of free fluid or adnexal mass.  IMPRESSION: Probable early intrauterine gestational sac, but no yolk sac, fetal pole, or cardiac activity yet visualized. Estimated gestational age by mean sac diameter is 5 weeks 1 day. Recommend follow-up quantitative B-HCG levels and follow-up US in 14 days to confirm and assess viability. This  recommendation follows SRU consensus guidelines: Diagnostic Criteria for Nonviable Pregnancy Early in the First Trimester. Malva Limes Med 2013; 696:2952-84.   Electronically Signed   By: Harmon Pier M.D.   On: 04/10/2015 22:31   US Ob Transvaginal  04/10/2015   CLINICAL DATA:  34 year old pregnant female with abdominal pain. Estimated gestational age of [redacted] weeks 5 days by LMP.  EXAM: OBSTETRIC <14 WK [place] AND TRANSVAGINAL OB US  TECHNIQUE: Both transabdominal and transvaginal ultrasound examinations were performed for complete evaluation of the gestation as well as the maternal uterus, adnexal regions, and pelvic cul-de-sac. Transvaginal technique was performed to assess early pregnancy.  COMPARISON:  None.  FINDINGS: Intrauterine gestational sac: A cystic structure within the central retroverted uterus measures 3.7 mm in mean diameter and probably represents an early gestational sac.  Yolk sac:  Not visualized  Embryo:  Not visualized  Cardiac Activity: Not visualized  MSD: 3.7  mm   5 w   1  d              [place] EDC: 12/10/2015  Maternal uterus/adnexae: The ovaries bilaterally are unremarkable.  There is no evidence of free fluid or adnexal mass.  IMPRESSION: Probable early intrauterine gestational sac, but no yolk sac, fetal pole, or cardiac activity yet visualized. Estimated gestational age by mean sac diameter is 5 weeks 1 day. Recommend follow-up quantitative B-HCG levels and follow-up US in 14 days to confirm and assess viability. This recommendation follows SRU consensus guidelines: Diagnostic Criteria for Nonviable Pregnancy Early in the First Trimester. Malva Limes Med 2013; 132:4401-02.   Electronically Signed   By: Tinnie Gens  Hu M.D.   On: 04/10/2015 22:31    MAU Course  Procedures Nonoe  MDM +UPT UA, wet prep, GC/chlamydia, CBC, ABO/Rh, quant hCG, HIV, RPR and [place] today to rule out ectopic pregnancy  Assessment and Plan  A: Possible early IUGS without YS or FP Abdominal pain in pregnancy Spotting in  pregnancy  P: Discharge home Bleeding and ectopic precautions discussed Pelvic rest advised Patient advised to follow-up in MAU in 48 hours for repeat labs or sooner if her condition were to change or worsen  Marny Lowenstein, PA-C  04/10/2015, 10:44 PM

## 2015-04-11 LAB — HIV ANTIBODY (ROUTINE TESTING W REFLEX): HIV Screen 4th Generation wRfx: NONREACTIVE

## 2015-04-11 LAB — RPR: RPR Ser Ql: NONREACTIVE

## 2015-04-11 LAB — ABO/RH: ABO/RH(D): A POS

## 2015-04-12 ENCOUNTER — Inpatient Hospital Stay (HOSPITAL_COMMUNITY)
Admission: AD | Admit: 2015-04-12 | Discharge: 2015-04-12 | Disposition: A | Discharge status: Home / Self Care | Payer: Managed Care, Other (non HMO) | Source: Ambulatory Visit | Attending: Obstetrics and Gynecology | Admitting: Obstetrics and Gynecology

## 2015-04-12 DIAGNOSIS — O039 Complete or unspecified spontaneous abortion without complication: Secondary | ICD-10-CM | POA: Diagnosis not present

## 2015-04-12 DIAGNOSIS — Z3A01 Less than 8 weeks gestation of pregnancy: Secondary | ICD-10-CM | POA: Diagnosis not present

## 2015-04-12 LAB — HCG, QUANTITATIVE, PREGNANCY: hCG, Beta Chain, Quant, S: 27 m[IU]/mL — ABNORMAL HIGH (ref <5)

## 2015-04-12 NOTE — Discharge Instructions (Signed)
Miscarriage A miscarriage is the sudden loss of an unborn baby (fetus) before the 20th week of pregnancy. Most miscarriages happen in the first 3 months of pregnancy. Sometimes, it happens before a woman even knows she is pregnant. A miscarriage is also called a "spontaneous miscarriage" or "early pregnancy loss." Having a miscarriage can be an emotional experience. Talk with your caregiver about any questions you may have about miscarrying, the grieving process, and your future pregnancy plans. CAUSES   Problems with the fetal chromosomes that make it impossible for the baby to develop normally. Problems with the baby's genes or chromosomes are most often the result of errors that occur, by chance, as the embryo divides and grows. The problems are not inherited from the parents.  Infection of the cervix or uterus.   Hormone problems.   Problems with the cervix, such as having an incompetent cervix. This is when the tissue in the cervix is not strong enough to hold the pregnancy.   Problems with the uterus, such as an abnormally shaped uterus, uterine fibroids, or congenital abnormalities.   Certain medical conditions.   Smoking, drinking alcohol, or taking illegal drugs.   Trauma.  Often, the cause of a miscarriage is unknown.  SYMPTOMS   Vaginal bleeding or spotting, with or without cramps or pain.  Pain or cramping in the abdomen or lower back.  Passing fluid, tissue, or blood clots from the vagina. DIAGNOSIS  Your caregiver will perform a physical exam. You may also have an ultrasound to confirm the miscarriage. Blood or urine tests may also be ordered. TREATMENT   Sometimes, treatment is not necessary if you naturally pass all the fetal tissue that was in the uterus. If some of the fetus or placenta remains in the body (incomplete miscarriage), tissue left behind may become infected and must be removed. Usually, a dilation and curettage (D and C) procedure is performed.  During a D and C procedure, the cervix is widened (dilated) and any remaining fetal or placental tissue is gently removed from the uterus.  Antibiotic medicines are prescribed if there is an infection. Other medicines may be given to reduce the size of the uterus (contract) if there is a lot of bleeding.  If you have Rh negative blood and your baby was Rh positive, you will need a Rh immunoglobulin shot. This shot will protect any future baby from having Rh blood problems in future pregnancies. HOME CARE INSTRUCTIONS   Your caregiver may order bed rest or may allow you to continue light activity. Resume activity as directed by your caregiver.  Have someone help with home and family responsibilities during this time.   Keep track of the number of sanitary pads you use each day and how soaked (saturated) they are. Write down this information.   Do not use tampons. Do not douche or have sexual intercourse until approved by your caregiver.   Only take over-the-counter or prescription medicines for pain or discomfort as directed by your caregiver.   Do not take aspirin. Aspirin can cause bleeding.   Keep all follow-up appointments with your caregiver.   If you or your partner have problems with grieving, talk to your caregiver or seek counseling to help cope with the pregnancy loss. Allow enough time to grieve before trying to get pregnant again.  SEEK IMMEDIATE MEDICAL CARE IF:   You have severe cramps or pain in your back or abdomen.  You have a fever.  You pass large blood clots (walnut-sized   or larger) ortissue from your vagina. Save any tissue for your caregiver to inspect.   Your bleeding increases.   You have a thick, bad-smelling vaginal discharge.  You become lightheaded, weak, or you faint.   You have chills.  MAKE SURE YOU:  Understand these instructions.  Will watch your condition.  Will get help right away if you are not doing well or get  worse. Document Released: 01/18/2001 Document Revised: 11/19/2012 Document Reviewed: 09/13/2011 ExitCare Patient Information 2015 [employer]. This information is not intended to replace advice given to you by your health care provider. Make sure you discuss any questions you have with your health care provider.  

## 2015-04-12 NOTE — MAU Note (Signed)
Pt reports she continues to have some bleeding but describes it as not heavy, denies pain.

## 2015-04-12 NOTE — MAU Provider Note (Signed)
History   None      Chief Complaint:  No chief complaint on file.   Amber Copeland is  34 y.o. G2P0010 Patient's last menstrual period was 03/08/2015.Marland Kitchen Patient is here for follow up of quantitative HCG and ongoing surveillance of pregnancy status.   She is [redacted]w[redacted]d weeks gestation  by [place]  Since her last visit, the patient is without new complaint.   The patient reports bleeding as  spotting.  No pain, cramping has subsided  General ROS:  negative  Her previous Quantitative HCG values are:  Lab Results  Component Value Date   HCGBETAQNT 27* 04/12/2015   HCGBETAQNT 109* 04/10/2015   HCGBETAQNT 0.13 11/08/2011    Physical Exam   Blood pressure 120/78, pulse 64, temperature 98.4 F (36.9 C), temperature source Oral, resp. rate 16, last menstrual period 03/08/2015, SpO2 100 %.  Labs: Results for orders placed or performed during the hospital encounter of 04/12/15 (from the past 24 hour(s))  hCG, quantitative, pregnancy   Collection Time: 04/12/15  8:30 PM  Result Value Ref Range   hCG, Beta Chain, Quant, S 27 (H) <5 mIU/mL    Ultrasound Studies:   US Ob Comp Less 14 Wks  04/10/2015   CLINICAL DATA:  34 year old pregnant female with abdominal pain. Estimated gestational age of [redacted] weeks 5 days by LMP.  EXAM: OBSTETRIC <14 WK [place] AND TRANSVAGINAL OB US  TECHNIQUE: Both transabdominal and transvaginal ultrasound examinations were performed for complete evaluation of the gestation as well as the maternal uterus, adnexal regions, and pelvic cul-de-sac. Transvaginal technique was performed to assess early pregnancy.  COMPARISON:  None.  FINDINGS: Intrauterine gestational sac: A cystic structure within the central retroverted uterus measures 3.7 mm in mean diameter and probably represents an early gestational sac.  Yolk sac:  Not visualized  Embryo:  Not visualized  Cardiac Activity: Not visualized  MSD: 3.7  mm   5 w   1  d              [place] EDC: 12/10/2015  Maternal uterus/adnexae: The ovaries  bilaterally are unremarkable.  There is no evidence of free fluid or adnexal mass.  IMPRESSION: Probable early intrauterine gestational sac, but no yolk sac, fetal pole, or cardiac activity yet visualized. Estimated gestational age by mean sac diameter is 5 weeks 1 day. Recommend follow-up quantitative B-HCG levels and follow-up US in 14 days to confirm and assess viability. This recommendation follows SRU consensus guidelines: Diagnostic Criteria for Nonviable Pregnancy Early in the First Trimester. Malva Limes Med 2013; 782:9562-13.   Electronically Signed   By: Harmon Pier M.D.   On: 04/10/2015 22:31   US Ob Transvaginal  04/10/2015   CLINICAL DATA:  34 year old pregnant female with abdominal pain. Estimated gestational age of [redacted] weeks 5 days by LMP.  EXAM: OBSTETRIC <14 WK [place] AND TRANSVAGINAL OB US  TECHNIQUE: Both transabdominal and transvaginal ultrasound examinations were performed for complete evaluation of the gestation as well as the maternal uterus, adnexal regions, and pelvic cul-de-sac. Transvaginal technique was performed to assess early pregnancy.  COMPARISON:  None.  FINDINGS: Intrauterine gestational sac: A cystic structure within the central retroverted uterus measures 3.7 mm in mean diameter and probably represents an early gestational sac.  Yolk sac:  Not visualized  Embryo:  Not visualized  Cardiac Activity: Not visualized  MSD: 3.7  mm   5 w   1  d  [place] EDC: 12/10/2015  Maternal uterus/adnexae: The ovaries bilaterally are unremarkable.  There is no evidence of free fluid or adnexal mass.  IMPRESSION: Probable early intrauterine gestational sac, but no yolk sac, fetal pole, or cardiac activity yet visualized. Estimated gestational age by mean sac diameter is 5 weeks 1 day. Recommend follow-up quantitative B-HCG levels and follow-up US in 14 days to confirm and assess viability. This recommendation follows SRU consensus guidelines: Diagnostic Criteria for Nonviable Pregnancy Early in the  First Trimester. Malva Limes Med 2013; 696:2952-84.   Electronically Signed   By: Harmon Pier M.D.   On: 04/10/2015 22:31    Assessment: SAB,  [redacted]w[redacted]d weeks gestation   Plan: No f/u needed.  To continue PNV, as this was a desired pregnancy and they will probably try again.   CRESENZO-DISHMAN,Rance Smithson 04/12/2015, 10:11 PM

## 2015-04-14 LAB — GC/CHLAMYDIA PROBE AMP (~~LOC~~) NOT AT ARMC
CHLAMYDIA, DNA PROBE: NEGATIVE
Neisseria Gonorrhea: NEGATIVE

## 2015-04-21 ENCOUNTER — Other Ambulatory Visit: Payer: Self-pay | Admitting: Obstetrics and Gynecology

## 2015-04-22 LAB — CYTOLOGY - PAP

## 2015-10-05 ENCOUNTER — Ambulatory Visit (INDEPENDENT_AMBULATORY_CARE_PROVIDER_SITE_OTHER): Payer: Managed Care, Other (non HMO) | Admitting: Family Medicine

## 2015-10-05 VITALS — BP 112/78 | HR 62 | Temp 98.6°F | Resp 18 | Ht 66.0 in | Wt 130.8 lb

## 2015-10-05 DIAGNOSIS — R1030 Lower abdominal pain, unspecified: Secondary | ICD-10-CM | POA: Diagnosis not present

## 2015-10-05 DIAGNOSIS — B9689 Other specified bacterial agents as the cause of diseases classified elsewhere: Secondary | ICD-10-CM

## 2015-10-05 DIAGNOSIS — R300 Dysuria: Secondary | ICD-10-CM | POA: Diagnosis not present

## 2015-10-05 DIAGNOSIS — A499 Bacterial infection, unspecified: Secondary | ICD-10-CM | POA: Diagnosis not present

## 2015-10-05 DIAGNOSIS — N760 Acute vaginitis: Secondary | ICD-10-CM

## 2015-10-05 LAB — POC MICROSCOPIC URINALYSIS (UMFC): Mucus: ABSENT

## 2015-10-05 LAB — POCT CBC
GRANULOCYTE PERCENT: 46.4 % (ref 37–80)
HCT, POC: 37 % — AB (ref 37.7–47.9)
Hemoglobin: 12.9 g/dL (ref 12.2–16.2)
Lymph, poc: 1.8 (ref 0.6–3.4)
MCH: 31 pg (ref 27–31.2)
MCHC: 34.8 g/dL (ref 31.8–35.4)
MCV: 88.9 fL (ref 80–97)
MID (CBC): 0.1 (ref 0–0.9)
MPV: 7.6 fL (ref 0–99.8)
PLATELET COUNT, POC: 203 10*3/uL (ref 142–424)
POC GRANULOCYTE: 1.7 — AB (ref 2–6.9)
POC LYMPH PERCENT: 49.7 %L (ref 10–50)
POC MID %: 3.9 % (ref 0–12)
RBC: 4.16 M/uL (ref 4.04–5.48)
RDW, POC: 13.5 %
WBC: 3.6 10*3/uL — AB (ref 4.6–10.2)

## 2015-10-05 LAB — POCT URINALYSIS DIP (MANUAL ENTRY)
BILIRUBIN UA: NEGATIVE
Glucose, UA: NEGATIVE
Ketones, POC UA: NEGATIVE
Leukocytes, UA: NEGATIVE
Nitrite, UA: NEGATIVE
PH UA: 5.5
Protein Ur, POC: NEGATIVE
Spec Grav, UA: >=1.03
Urobilinogen, UA: 0.2

## 2015-10-05 LAB — POCT WET + KOH PREP
Trich by wet prep: ABSENT
Yeast by wet prep: ABSENT

## 2015-10-05 LAB — POCT URINE PREGNANCY: Preg Test, Ur: NEGATIVE

## 2015-10-05 MED ORDER — METRONIDAZOLE 500 MG PO TABS: 500.0000 mg | ORAL_TABLET | Freq: Two times a day (BID) | ORAL | Status: AC

## 2015-10-05 NOTE — Patient Instructions (Signed)
Great to meet you!  Abdominal Pain, Adult Many things can cause belly (abdominal) pain. Most times, the belly pain is not dangerous. Many cases of belly pain can be watched and treated at home. HOME CARE   Do not take medicines that help you go poop (laxatives) unless told to by your doctor.  Only take medicine as told by your doctor.  Eat or drink as told by your doctor. Your doctor will tell you if you should be on a special diet. GET HELP IF:  You do not know what is causing your belly pain.  You have belly pain while you are sick to your stomach (nauseous) or have runny poop (diarrhea).  You have pain while you pee or poop.  Your belly pain wakes you up at night.  You have belly pain that gets worse or better when you eat.  You have belly pain that gets worse when you eat fatty foods.  You have a fever. GET HELP RIGHT AWAY IF:   The pain does not go away within 2 hours.  You keep throwing up (vomiting).  The pain changes and is only in the right or left part of the belly.  You have bloody or tarry looking poop. MAKE SURE YOU:   Understand these instructions.  Will watch your condition.  Will get help right away if you are not doing well or get worse.   This information is not intended to replace advice given to you by your health care provider. Make sure you discuss any questions you have with your health care provider.   Document Released: 01/11/2008 Document Revised: 08/15/2014 Document Reviewed: 04/03/2013 Elsevier Interactive Patient Education [employer]

## 2015-10-05 NOTE — Progress Notes (Signed)
   HPI  Patient presents today  Here with abdominal pain in vaginal bleeding.  Patient explains that  over last 2 weeks she's had sharp to dull crampy abdominal pain. About 2 days ago she developed dysuria which was helped by vaginal cream.  She states that initially the pain was pretty severe , however it has improved. Today it seems to have gotten worse with drinking water but otherwise it has not had any association with eating or drinking.  It has improved over the last 2 weeks  She denies any fever , food or fluid intolerance, constipation, or diarrhea. Her last menstrual period was every third,  Over the last 2-3 days she's developed vaginal bleeding again which would be  About 1- 2 weeks early for her.   She is not taking any contraceptives and her and her husband hope to get pregnant.  she has had a negative pregnancy test at home   Denies depression and SI, mood doing well  PMH: Smoking status noted Her past medical, surgical, social, family history reviewed and updated in EMR.  ROS: Per HPI  Objective: BP 112/78 mmHg  Pulse 62  Temp(Src) 98.6 F (37 C) (Oral)  Resp 18  Ht  (1.676 m)  Wt 130 lb 12.8 oz (59.33 kg)  BMI 21.12 kg/m2  SpO2 98%  LMP 09/11/2015  Breastfeeding? Unknown Gen: NAD, alert, cooperative with exam HEENT: NCAT CV: RRR, good S1/S2, no murmur Resp: CTABL, no wheezes, non-labored Abd:  Soft, tenderness to palpation throughout, right upper quadrant and left lower  Is the most severe. Ext: No edema, warm Neuro: Alert and oriented, No gross deficits  GU: Normal-appearing perineum, gross blood on the vulva, cervix normal appearing and nontender to palpation, no cervical motion tenderness or adnexal fullness or tenderness with bimanual exam - bloody cervical discharge  Assessment and plan:  # abdominal pain Improving abdominal pain per her history. However her abdominal exam is more significant than expected. Wet prep and CBC were normal.  (slightly low WBC) Urinalysis is normal. Considering normal oral food and fluid intake, I think she is safe to be discharged home. Reasons to seek emergency medical care reviewed  BV Tx with flagyl, this may account for dysuria but unlikely causing abd pain.  She does have vaginal irritation  Depression Doing well Denies SI     Orders Placed This Encounter  Procedures  . GC/Chlamydia Probe Amp    Order Specific Question:  Source    Answer:  urine  . POCT urinalysis dipstick  . POCT urine pregnancy  . POCT Microscopic Urinalysis ([facility])  . POCT CBC  . POCT Wet + KOH Prep     Murtis Sink, MD [facility] 10/05/2015, 9:58 AM

## 2015-10-05 NOTE — Addendum Note (Signed)
Addended by: Felix Ahmadi A on: 10/05/2015 11:37 AM   Modules accepted: Kipp Brood

## 2015-10-06 LAB — GC/CHLAMYDIA PROBE AMP
CT PROBE, AMP APTIMA: NOT DETECTED
GC PROBE AMP APTIMA: NOT DETECTED

## 2016-12-20 ENCOUNTER — Other Ambulatory Visit: Payer: Self-pay | Admitting: Obstetrics and Gynecology

## 2016-12-21 LAB — CYTOLOGY - PAP
